# Patient Record
Sex: Female | Born: 1970 | Race: Black or African American | Hispanic: No | Marital: Married | Smoking: Never smoker
Health system: Southern US, Community
[De-identification: ages and names within clinical notes are randomized; demographics above are authoritative.]

## PROBLEM LIST (undated history)

## (undated) DIAGNOSIS — H409 Unspecified glaucoma: Secondary | ICD-10-CM

## (undated) DIAGNOSIS — J40 Bronchitis, not specified as acute or chronic: Secondary | ICD-10-CM

## (undated) DIAGNOSIS — I739 Peripheral vascular disease, unspecified: Secondary | ICD-10-CM

## (undated) DIAGNOSIS — F419 Anxiety disorder, unspecified: Secondary | ICD-10-CM

## (undated) DIAGNOSIS — R112 Nausea with vomiting, unspecified: Secondary | ICD-10-CM

## (undated) DIAGNOSIS — D649 Anemia, unspecified: Secondary | ICD-10-CM

## (undated) DIAGNOSIS — I10 Essential (primary) hypertension: Secondary | ICD-10-CM

## (undated) DIAGNOSIS — Z9889 Other specified postprocedural states: Secondary | ICD-10-CM

## (undated) DIAGNOSIS — E785 Hyperlipidemia, unspecified: Secondary | ICD-10-CM

## (undated) DIAGNOSIS — M65312 Trigger thumb, left thumb: Secondary | ICD-10-CM

## (undated) HISTORY — DX: Hyperlipidemia, unspecified: E78.5

## (undated) HISTORY — PX: DIAGNOSTIC LAPAROSCOPY: SUR761

## (undated) HISTORY — PX: COLONOSCOPY: SHX174

## (undated) HISTORY — PX: WISDOM TOOTH EXTRACTION: SHX21

## (undated) HISTORY — DX: Unspecified glaucoma: H40.9

## (undated) HISTORY — PX: ABDOMINAL HYSTERECTOMY: SHX81

---

## 1999-03-20 ENCOUNTER — Other Ambulatory Visit: Admission: RE | Admit: 1999-03-20 | Discharge: 1999-03-20 | Payer: Self-pay | Admitting: Obstetrics & Gynecology

## 1999-09-09 HISTORY — PX: MYOMECTOMY: SHX85

## 1999-10-08 ENCOUNTER — Inpatient Hospital Stay (HOSPITAL_COMMUNITY): Admission: RE | Admit: 1999-10-08 | Discharge: 1999-10-10 | Payer: Self-pay | Admitting: Obstetrics & Gynecology

## 1999-10-08 ENCOUNTER — Encounter (INDEPENDENT_AMBULATORY_CARE_PROVIDER_SITE_OTHER): Payer: Self-pay

## 2000-04-28 ENCOUNTER — Other Ambulatory Visit: Admission: RE | Admit: 2000-04-28 | Discharge: 2000-04-28 | Payer: Self-pay | Admitting: Obstetrics & Gynecology

## 2001-06-21 ENCOUNTER — Other Ambulatory Visit: Admission: RE | Admit: 2001-06-21 | Discharge: 2001-06-21 | Payer: Self-pay | Admitting: Obstetrics & Gynecology

## 2002-07-12 ENCOUNTER — Other Ambulatory Visit: Admission: RE | Admit: 2002-07-12 | Discharge: 2002-07-12 | Payer: Self-pay | Admitting: Obstetrics & Gynecology

## 2003-08-09 ENCOUNTER — Other Ambulatory Visit: Admission: RE | Admit: 2003-08-09 | Discharge: 2003-08-09 | Payer: Self-pay | Admitting: Obstetrics & Gynecology

## 2004-08-18 ENCOUNTER — Other Ambulatory Visit: Admission: RE | Admit: 2004-08-18 | Discharge: 2004-08-18 | Payer: Self-pay | Admitting: Obstetrics & Gynecology

## 2004-11-06 ENCOUNTER — Other Ambulatory Visit: Admission: RE | Admit: 2004-11-06 | Discharge: 2004-11-06 | Payer: Self-pay | Admitting: Obstetrics & Gynecology

## 2005-04-14 ENCOUNTER — Other Ambulatory Visit: Admission: RE | Admit: 2005-04-14 | Discharge: 2005-04-14 | Payer: Self-pay | Admitting: Obstetrics & Gynecology

## 2005-08-24 ENCOUNTER — Other Ambulatory Visit: Admission: RE | Admit: 2005-08-24 | Discharge: 2005-08-24 | Payer: Self-pay | Admitting: Obstetrics & Gynecology

## 2006-11-04 ENCOUNTER — Ambulatory Visit (HOSPITAL_COMMUNITY): Admission: RE | Admit: 2006-11-04 | Discharge: 2006-11-04 | Payer: Self-pay | Admitting: Obstetrics and Gynecology

## 2006-11-04 ENCOUNTER — Encounter (INDEPENDENT_AMBULATORY_CARE_PROVIDER_SITE_OTHER): Payer: Self-pay | Admitting: Obstetrics and Gynecology

## 2007-03-12 HISTORY — PX: MYOMECTOMY: SHX85

## 2007-03-21 ENCOUNTER — Encounter (INDEPENDENT_AMBULATORY_CARE_PROVIDER_SITE_OTHER): Payer: Self-pay | Admitting: Obstetrics & Gynecology

## 2007-03-21 ENCOUNTER — Inpatient Hospital Stay (HOSPITAL_COMMUNITY): Admission: RE | Admit: 2007-03-21 | Discharge: 2007-03-23 | Payer: Self-pay | Admitting: Obstetrics & Gynecology

## 2008-09-03 ENCOUNTER — Encounter (INDEPENDENT_AMBULATORY_CARE_PROVIDER_SITE_OTHER): Payer: Self-pay | Admitting: Obstetrics and Gynecology

## 2008-09-03 ENCOUNTER — Ambulatory Visit (HOSPITAL_COMMUNITY): Admission: RE | Admit: 2008-09-03 | Discharge: 2008-09-03 | Payer: Self-pay | Admitting: Obstetrics and Gynecology

## 2009-08-01 ENCOUNTER — Inpatient Hospital Stay (HOSPITAL_COMMUNITY): Admission: AD | Admit: 2009-08-01 | Discharge: 2009-08-01 | Payer: Self-pay | Admitting: Obstetrics & Gynecology

## 2009-09-04 ENCOUNTER — Inpatient Hospital Stay (HOSPITAL_COMMUNITY): Admission: RE | Admit: 2009-09-04 | Discharge: 2009-09-07 | Payer: Self-pay | Admitting: Obstetrics & Gynecology

## 2010-07-29 LAB — RH IMMUNE GLOB WKUP(>/=20WKS)(NOT WOMEN'S HOSP)

## 2010-07-29 LAB — RPR: RPR Ser Ql: NONREACTIVE

## 2010-07-29 LAB — CBC
MCHC: 33.9 g/dL (ref 30.0–36.0)
MCV: 91.5 fL (ref 78.0–100.0)
MCV: 92 fL (ref 78.0–100.0)
Platelets: 162 10*3/uL (ref 150–400)
RDW: 14 % (ref 11.5–15.5)
RDW: 14.1 % (ref 11.5–15.5)
WBC: 12.7 10*3/uL — ABNORMAL HIGH (ref 4.0–10.5)

## 2010-08-20 LAB — CBC
HCT: 37.4 % (ref 36.0–46.0)
MCHC: 34.7 g/dL (ref 30.0–36.0)
MCV: 93.1 fL (ref 78.0–100.0)
RBC: 4.02 MIL/uL (ref 3.87–5.11)

## 2010-08-20 LAB — PREGNANCY, URINE: Preg Test, Ur: NEGATIVE

## 2010-09-23 NOTE — Op Note (Signed)
NAMEKIMLEY, APSEY                  ACCOUNT NO.:  [id number]   MEDICAL RECORD NO.:  [id number]          PATIENT TYPE:  INP   LOCATION:  9303                          FACILITY:  [facility]   PHYSICIAN:  Freddy Finner, M.D.   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  A total of three myomas removed, varying in size from less than  1 cm, to approximately 3 cm x 2 cm.   OPERATIVE PROCEDURE:  Myomectomy.   ESTIMATED INTRAOPERATIVE BLOOD LOSS:  Was 100 mL.   INTRAOPERATIVE COMPLICATIONS:  None.   SURGEON:  Freddy Finner, M.D.   ASSISTANT:  Duke Salvia. Marcelle Overlie, M.D.   ANESTHESIA:  General endotracheal anesthesia.   PREOPERATIVE PROPHYLACTIC MEASURES:  Included a bolus of Cefoxitin 1  gram and PAS compression hose.   INDICATIONS FOR PROCEDURE:  The history of the present illness recorded  in the admission note.   DESCRIPTION OF PROCEDURE:  The patient was brought to the operating room  and placed under adequate general endotracheal anesthesia.  She was  placed in the dorsal recumbent position.  The abdomen, perineum and  vagina were prepped and draped in the usual fashion with Betadine scrub  and Betadine solution.  The Foley catheter was placed using a sterile  technique.  After the application of sterile drapes, a lower abdominal  incision was made in a transverse direction just above the symphysis  through an old scar.  A  dissection was carried sharply to fascia which  was entered sharply and extended to the extent of the skin incision.  The rectus sheath was developed superiorly and inferiorly with blunt and  sharp dissection.  The rectus muscles were divided in the midline.  The  subcutaneous and sub-fascial vessels were controlled with the Bovie.  The peritoneum was identified and entered bluntly with the Mayo scissors  and extended sharply and bluntly to the extent of the skin incision.  Exploration of the upper abdomen revealed no palpable abnormality of the  liver, gallbladder, kidneys or other apparent adhesions or palpable  abnormalities.  The appendix was visualized and was normal.  The tubes  and ovaries were carefully inspected and found to be completely normal.  The only pelvic adhesions were on advancement of the peritoneum  anteriorly near the bladder flap.  This was consistent with previous  myomectomy.  The self-retaining McCullough-O'Sullivan retractor was  used.  Moist packs were used to pack the intestinal contents out of the  pelvis.  The pelvis was filled with sloppy wet packs to elevate the  uterus into the operative field.  The initial incision was made along  the posterior fundus in the midline, and the largest myoma with an  attached smaller myoma was resected.  This included entry into the  endometrial cavity.  The endometrial cavity was carefully preserved and  the myometrium closed in two layers.  The first layer was used to  recreate the endometrial cavity and to close the deep layer of  myometrium.  The superficial myometrium was closed with a running  vesical suture of #2-0 Monocryl and #0 Monocryl pop-offs were used for  the deep layer.  The adhesions anteriorly were released and the myoma  excised from the lower uterine segment just lateral to the midline.  This defect was closed with figure-of-eights  with #0 Monocryl.  A small  sub-serosal lesion was removed from the left  posterior fundus.  It  measured approximately 5 to 6 mm.  An even smaller lesion was removed  from the left anterior fundus, measuring approximately 3 to 4 mm.  No  other palpable abnormalities could be appreciated.  Figure-of-eights  with #2-0 Monocryl were used to close the smaller incisions and a larger  one anteriorly along the lower segment was closed with figure-of-eights  of #0 Monocryl.  The peritoneum was reapproximated over the lesion on  the anterior lower segment.  Copious irrigation was carried out.  Hemostasis was complete.  All packs, needles and instruments were  removed and the counts were correct.  Irrigation solution was aspirated.  Hemostasis was complete.  Intercede was placed over the posterior and  upper and anterior fundus.  The uterus was allowed to settle into the  pelvis.  The abdominal incision was then closed in layers.  Running #0  Vicryl was used to close the peritoneum and reapproximate the rectus  muscles.  The fascia was closed with running #0 PDS running from angle  to angle.  The subcutaneous tissue was approximated with running #2-0  plain.  The skin was closed with broad skin staples and 1/4 inch Steri-  Strips.   The patient was awakened and taken to the recovery room in good  condition.      Freddy Finner, M.D.  Electronically Signed     WRN/MEDQ  D:  03/21/2007  T:  03/22/2007  Job:  [id number]

## 2010-09-23 NOTE — Op Note (Signed)
Kathy Kathy Garrett, Kathy Garrett                  ACCOUNT NO.:  [id number]   MEDICAL RECORD NO.:  [id number]          PATIENT TYPE:  AMB   LOCATION:  SDC                           FACILITY:  [facility]   PHYSICIAN:  Fermin Schwab, MD   DATE OF BIRTH:  [birth year]   DATE OF PROCEDURE:  09/03/2008  DATE OF DISCHARGE:                               OPERATIVE REPORT   PREOPERATIVE DIAGNOSES:  Probable intrauterine adhesions, probable  pelvic adhesions.   POSTOPERATIVE DIAGNOSES:  Endometrial polyp, pelvic adhesions, anterior  intramural myoma.   PROCEDURES:  1. Hysteroscopy.  2. Suction curettage.  3. Laparoscopy.  4. Lysis of adhesions.  5. Enterolysis.   SURGEON:  Fermin Schwab, MD   ANESTHESIA:  General anesthesia.   FINDINGS:  On exam under anesthesia, the uterus was normal-sized,  anteverted, and mobile.  No adnexal masses were palpable.  On  hysteroscopy, the endocervical canal was normal.  The endometrial cavity  was slightly rotated to the right, status post previous myomectomies.  There was anterior polypoid thickening of the endometrium protruding  into the canal, covering the entire endometrial surface.  A gland  opening similar to adenomyosis was noted anteriorly.  In the distorted  configuration of the uterus, the ostial lesions were identified, but the  tubal ostia were sealed over with endometrium.  The uterus sounded to  8.5 cm.  On laparoscopy, inspection of the liver surface, gallbladder,  and diaphragm surfaces showed within normal findings.  Appendix was  normal.  There were dense anterior lower uterine segment adhesions to  the bladder peritoneum, these were lysed.  The uterus contained a 2 x 2  cm anterior intramural myoma.  The posterior cul-de-sac was normal.  The  left tube was normal in its entire course with fimbria 5/5 on  chromotubation of the left tube filled and spilled.  The left ovary was  somewhat small and appeared within normal limits.  The right tube was  adherent to the posterior uterine corpus at several points, giving it a  convoluted look.  The adhesions were dense.  There was also a dense  adhesion pulling the distal end of the tube and joining it to the small  bowel.  The fimbria was otherwise 5/5.  The right tube filled with  chromotubation in a sluggish manner and did not spill (preoperative HSG  had shown the right tube to be patent). The right ovary had a corpus  luteum and it was free of adhesions.  There was no peritoneal lesions  such as endometriosis.   DESCRIPTION OF PROCEDURE:  The patient was placed in lithotomy position.  A 1 g of cefazolin was given intravenously for prophylaxis.  General  endotracheal anesthesia was given.  The patient was placed in lithotomy  position.  Abdomen, peritoneum, and vagina were prepped.  The patient  was draped in a sterile manner.  A Foley catheter was inserted.  Exam  under anesthesia revealed the above findings.  A vaginal speculum was  inserted.  The cervix was grasped with a tenaculum.  Using 3% sorbitol,  diagnostic hysteroscopy was started  with slender 30-degree hysteroscope.  The above findings were noted.  Using hysteroscopic scissors, some of  the anterior endometrium was shaved off.  Due to the size of the  polypoid endometrium, a size 7 curette was introduced into the areas  after cervical dilation to 23-French.  Suction curettage was performed  and moderate amount of tissue was obtained.  Hysteroscopy was repeated  after D and C and the tubal ostia were spilled, not clearly visualized.  The hysteroscopy was terminated.  Tissue was submitted to Pathology.  Surgeon was regloved and after placing a Zumi catheter and inflating its  balloon in the uterus.  This was connected to indigo carmine dilute  solution and used for manipulation and uterine injection.  Next, a field  was created on the abdomen and infraumbilical 10-mm skin incision was  made after preoperative anesthesia with  0.25% Marcaine.  A Veress needle  was inserted, and its correct location was verified.  The  pneumoperitoneum was created with carbon dioxide.  Two 5-mm lower  quadrant incisions were placed in each lower quadrant under direct  visualization.  Using monopolar needle electrode and cutting current of  35 watts, the tubal-intestinal adhesion was carefully lysed.  Next, the  right tube was freed up from all of its adhesions.  The chromotubation  showed above findings.  Repeat chromotubation failed to allow spillage  from the right tube, but we felt comfortable with patency demonstrated  by preoperative HSG on the right side.  A slurry of Seprafilm was  prepared with one sheet of Seprafilm and 20 mL of lactated Ringer and  injected into the posterior and anterior cul-de-sac with a 16-gauge red  rubber catheter in an attempt to prevent adhesions.  Estimated blood  loss was 20 mL.  Trocars were removed.  The sleeves were removed.  Gas  was allowed to come out.  Instruments and pad count was correct.  The  patient tolerated the procedure well and was transferred to recovery  room in satisfactory condition.      Fermin Schwab, MD  Electronically Signed     TY/MEDQ  D:  09/03/2008  T:  09/04/2008  Job:  [id number]   cc:   Freddy Finner, M.D.  Fax: [phone]

## 2010-09-23 NOTE — Op Note (Signed)
Kathy Garrett, Kathy Garrett                  ACCOUNT NO.:  [id number]   MEDICAL RECORD NO.:  [id number]          PATIENT TYPE:  AMB   LOCATION:  SDC                           FACILITY:  [facility]   PHYSICIAN:  Dineen Kid. Rana Snare, M.D.    DATE OF BIRTH:  [birth year]   DATE OF PROCEDURE:  11/04/2006  DATE OF DISCHARGE:                               OPERATIVE REPORT   PREOPERATIVE DIAGNOSIS:  Embryonic demise, 65 weeks' gestational age.   POSTOPERATIVE DIAGNOSIS:  Embryonic demise, 10 weeks' gestational age.   PROCEDURE:  Dilation and evacuation.   SURGEON:  Dineen Kid. Rana Snare, M.D.   ANESTHESIA:  Monitored anesthesia care and paracervical block.   INDICATIONS:  Ms. Castiglia is a 40 year old, G1, P0, at 80 weeks'  gestational age who presented with spotting yesterday to the office.  Ultrasound evaluation shows embryonic demise. Fetus measured 6-1/2  weeks' gestational size. She desires dilation and evacuation. The risks  and benefits of the procedure were discussed at length. Informed consent  was obtained. Her blood type was O positive.   DESCRIPTION OF PROCEDURE:  After analgesia, the patient placed in the  dorsal lithotomy position. She was sterilely prepped and draped. The  bladder was sterilely draped. A Graves speculum was placed. A tenaculum  was placed on the anterior lip of the cervix of the cervix. A  paracervical block was placed with 1% Xylocaine 1:100,000 epinephrine, a  total of 20 mL used. Uterus was dilated to a #29 Pratt dilator and was  sounded to 10 mL. An 8-mm suction curet was inserted. Products of  conception were retrieved. Curettage was performed until a gritty  surface was felt about the endometrial cavity and no more products of  contraception were being retrieved. The patient received 0.2 mg of  Methergine IM with good uterine response. The curet was removed.  Tenaculum removed from the anterior lip of the cervix. Cervix was noted  to be hemostatic. The patient was then transferred to  the recovery room  in stable condition. Sponge, needle and instrument count was normal x3.  Estimated blood loss was minimal. The patient received 30 mg of Toradol  postoperatively.   DISPOSITION:  The patient will be discharged home. Follow up in the  office in 2 to 3 weeks. Sent home with a routine instruction sheet for  D&E. Told to return for increased pain, fever or bleeding. She was sent  home with a prescription for doxycycline 100 mg p.o. b.i.d. for 7 days  and Methergine 0.2 mg to take every 8 hours for 2 days.      Dineen Kid Rana Snare, M.D.  Electronically Signed     DCL/MEDQ  D:  11/04/2006  T:  11/04/2006  Job:  [id number]

## 2010-09-23 NOTE — H&P (Signed)
Kathy Garrett, BAYARD                  ACCOUNT NO.:  [id number]   MEDICAL RECORD NO.:  [id number]          PATIENT TYPE:  AMB   LOCATION:  SDC                           FACILITY:  [facility]   PHYSICIAN:  Freddy Finner, M.D.   DATE OF BIRTH:  [birth year]   DATE OF ADMISSION:  DATE OF DISCHARGE:                              HISTORY & PHYSICAL   ADMISSION DIAGNOSES:  1. Recurrent uterine leiomyomata.  2. Large submucosal leiomyoma compromising reproductive function with      recent spontaneous abortion, probably secondary to fibroid.   The patient is a 40 year old black married female, gravida 1, para 0,  who has a long history of uterine leiomyomata and had a myomectomy in  2001.  She recently conceived and had a missed AB in June of 2008.  She  subsequently had a sonohysterogram in the office which showed a large  submucous myoma measuring 4.8 x 4.1 x 4.3 cm.  There was another  questionable 3 cm density within the uterine cavity.  In addition there  were at least three, perhaps four, other myomas measuring between 10 mm  and 3.8 cm.  The patient wishes to preserve reproductive capacity and  for that reason, is admitted now for a second myomectomy.  The potential  risks of the procedure including hemorrhage, infection, and hysterectomy  in the event of uncontrollable hemorrhage have been discussed with the  patient further.  The risk of deep vein thrombosis and injury to other  organs has been discussed, including prophylactic measures which will be  taken to reduce intraoperative risks.  The patient is now admitted and  prepared to proceed with surgery.   REVIEW OF SYSTEMS:  Her current review of systems is otherwise negative.  She has no cardiopulmonary, GI, or GU complaints.   PAST MEDICAL HISTORY:  Her only known medical illness is hypertension  for which she has been treated intermittently, most recently with  Diovan.   PAST SURGICAL HISTORY:  1. Myomectomy noted above.  2. She has  also had a D&C for incomplete spontaneous AB in June of      2008.  3. 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  She has never required a blood transfusion.   She does not use cigarettes.   FAMILY HISTORY:  Noncontributory.   PHYSICAL EXAMINATION:  HEENT:  Grossly within normal limits.  NECK:  Thyroid gland is not palpably enlarged.  CHEST:  Clear to auscultation.  HEART:  Normal sinus rhythm without murmurs, rubs, or gallops.  ABDOMEN:  Soft and nontender without appreciable organomegaly or  palpable masses.  BREASTS:  Exam is normal.  No palpable masses.  No nipple discharge or  skin change.  PELVIC:  External genitalia, vagina, and cervix are normal to  inspection.  Bimanual reveals the uterus to be anterior position,  approximately 10 weeks' size.  There are no palpable adnexal masses.  RECTUM:  Normal.  Rectovaginal exam confirms the above  findings.   Most recent blood pressure in the office 104/72.   Most recent Pap test in the office was in April of 2008 and was normal.  EXTREMITIES:  Without cyanosis, clubbing, or edema.   ASSESSMENT:  Uterine leiomyomata, large submucous myoma distorting the  endometrial cavity.   PLAN:  Laparotomy for myomectomy.      Freddy Finner, M.D.  Electronically Signed     WRN/MEDQ  D:  03/18/2007  T:  03/19/2007  Job:  [id number]

## 2010-09-26 NOTE — H&P (Signed)
[facility] [place]  Patient:    Kathy Garrett               MRN: [id number] Adm. Date:  10/08/99 Attending:  Freddy Finner, M.D.                         History and Physical  SOCIAL SECURITY NUMBER:       SS# [id number]  ADMITTING DIAGNOSIS:          Large uterine leiomyomata with recent increase in size.   HISTORY:                      The patient is a 40 year old single black female gravida 0, who has been followed in my office for approximately 11 years.  She was first noted to have uterine leiomyomata in November 2000, having had a normal exam the previous year in October 1999.  Pelvic ultrasound showed rather large fibroids measuring 5 x 4.7 x 3.0 cm and 8.8 x 6.7 x 4.2 cm.  Also there was a smaller 13 mm lesion on the anterior fundus.  She is now admitted for myomectomy.  CURRENT REVIEW OF SYSTEMS:    Negative.  There are no cardiopulmonary, GI, or GU complaints.  PAST MEDICAL HISTORY:         The patient has no known significant medical problems, except hypertension.  For this, she has taken Norvasc in the past, 10 mg a day.  ALLERGIES:                    She has no known allergies to medications.  This is her only current medication.  PAST SURGICAL HISTORY:        Her only previous surgical procedure is extraction of wisdom teeth at age 41.  She has never had a blood transfusion.   SOCIAL HISTORY:               She does not use cigarettes or alcohol.  FAMILY HISTORY:               Noncontributory.  PHYSICAL EXAMINATION  HEENT:                        Grossly within normal limits.  NECK:                         Thyroid gland is not palpably enlarged.  VITAL SIGNS:                  Blood pressure in the office is 128/74.  CHEST:                        Clear to auscultation.  CARDIAC:                      Heart normal sinus rhythm without murmurs, rubs, or gallops.  BREASTS:                      Exam is normal.  No  masses, no skin change, no nipple discharge.  ABDOMEN:                      Abdomen is soft and nontender without appreciable organomegaly or palpable masses.  PELVIC:  Bimanual exam reveals a regular enlargement of the uterus consistent with approximately nine weeks gestational size.  EXTREMITIES:                  Without cyanosis, clubbing, or edema.  ASSESSMENT:                   1. Uterine leiomyomata first noted approximately                                  six months prior to this admission.                               2. Rather marked enlargement of fibroids as                                  noted above.  PLAN:                         Myomectomy.  INFORMED CONSENT:             The potential risks of hysterectomy has been discussed with the patient including additional risks of surgery of hemorrhage and infection and injury to other organs.  She is prepared to proceed with surgery and is admitted at this time for that purpose. DD:  10/07/99 TD:  10/08/99 Job: [id number]

## 2010-09-26 NOTE — Discharge Summary (Signed)
[facility] [place]  Patient:    Kathy Garrett, Kathy Garrett               MRN: [id number] Adm. Date:  [id number] Disc. Date: [id number] Attending:  Minette Headland                           Discharge Summary  DISCHARGE DIAGNOSIS:          Multiple uterine leiomyomata with recent increase in size.  PROCEDURE:                    Myomectomy.  COMPLICATIONS:                None.  DISPOSITION:                  The patient is in satisfactory improved condition at the time of her discharge.  She is to have progressively increasing physical activity, but no heavy lifting.  She is to call for fever, severe pain, or heavy bleeding.  She is to take a regular diet as tolerated.  She is given Percocet to be taken as needed for postoperative pain.  She can also use Tylenol or ibuprofen. She is to use a mild laxative of choice and is to use Dulcolax suppositories as needed. She is to return to the office in two weeks for postoperative follow-up.  HISTORY OF PRESENT ILLNESS: PAST MEDICAL HISTORY: FAMILY HISTORY: REVIEW OF SYSTEMS: PHYSICAL EXAMINATION:         Details recorded in the admission note and/or the  operative summary.  The patient was admitted on the morning of surgery.  Her physical findings were remarkable for enlargement of the uterus with significant enlargement of fibroids with measurements of 8 and 5 cm of the two largest by ultrasound findings.  LABORATORY DATA:              During this admission includes a postoperative hemoglobin of 11.2, normal urinalysis on admission, hemoglobin of 13.5 on admission.  HOSPITAL COURSE:              The patient was admitted on the morning of surgery. The above described procedure was accomplished through an abdominal Pfannenstiel incision without difficulty.  Estimated blood loss was 150 cc.  Her postoperative course was without complications.  By the afternoon of the second hospital day, she was able  to have adequate flatus and was tolerating a regular diet.  This made er condition adequate for discharge.  She was discharged to home with disposition s noted above.  She is to have progressively increasing activity. DD:  10/10/99 TD:  10/14/99 Job: [id number]

## 2010-09-26 NOTE — Op Note (Signed)
[facility] [place]  Patient:    Kathy Garrett, Kathy Garrett               MRN: [id number] Proc. Date: 10/08/99 Adm. Date:  [id number] Attending:  Minette Headland                           Operative Report  PREOPERATIVE DIAGNOSIS:       Uterine leiomyomata with marked recent increase                               in size of leiomyomas.  POSTOPERATIVE DIAGNOSIS:      Uterine leiomyomata with marked recent increase                               in size of leiomyomas.  OPERATION:                    Myomectomy.  SURGEON:                      Freddy Finner, M.D.  ASSISTANT:  ANESTHESIA:                   General endotracheal  ESTIMATED BLOOD LOSS:         150 cc  INTRAOPERATIVE COMPLICATIONS: None.  INDICATIONS:                  The patient is a 40 year old admitted on the morning of surgery. For details of the present illness see the admission note. She was given a gram of Cefotan IV. She was placed in PAS hose.  DESCRIPTION OF PROCEDURE:     She was brought to the operating room and there placed under adequate general endotracheal anesthesia.  Placed in the dorsal lithotomy position.  Betadine prep of abdomen, perineum and vagina was carried out.  Foley catheter was placed using sterile technique. Sterile drapes were applied. Lower abdominal transverse incision was made and carried sharply down the fascia which was then sharply extended to the extent of the skin incision. Rectus sheath was developed superiorly and inferiorly with blunt and sharp dissection.  Rectus muscle was split in the midline.  Subfascial vessels were controlled with the Bovie.  Peritoneum was entered bluntly and extended sharply to the extent of the skin incision.  Palpation of the upper abdomen revealed no apparent abnormalities of gallbladder, liver, kidneys, bowel or omental adhesions or incarceration.  Appendix was visualized and was normal. Self retaining OConnor-OSullivan  retractor was placed. Moist pack was used to pack the intestinal contents out of the pelvis.  The uterus was markedly enlarged. Incision was made posteriorly over the fundal fibroid posteriorly. This was sharply dissected free and measured approximately 8 to 10 cm in maximum dimension.  A major bleeding source was controlled with interrupted 2-0 Vicryl suture. Deep layers of suture were placed using 0 Monocryl.  A small segment of the distended myometrium overlying the fibroid was excised. The defect was closed superficially with running 3-0 Monocryl. Hemostasis here was adequate.  There was a pedunculated lesion on the superior fundus.  There was an approximately 1 to 1.5 cm lesion anteriorly. Incision was over this and it was removed.  The defect was closed with two figure-of-eights of 0 Monocryl. There was a 2 mm fibroid  just adjacent to the pedunculated lesion on the superior fundus that was excised with the Bovie and the defect fulgurated with a Bovie.  The pedunculated lesion was excised. Defect was closed with interrupted of 3-0 Monocryl in a figure-of-eight pattern. Superficial closure was carried out with 3-0 Monocryl.  Irrigation was carried out.  Hemostasis was complete, placed over the lesion particularly over the posterior lesions and this was moistened with antibiotic solution.  Hemostasis was complete. All packs and instruments were removed.  Counts were correct.  Final incision was closed in layers with running 0 Monocryl close the peritoneum and reapproximate the rectus muscle.  Fascia was closed with a running PDS. Skin was closed with skin staples and 1/4 inch Steri-Strips.  The patient tolerate d the procedure well and was taken to recovery room in good condition. DD: 10/08/99 TD:  10/08/99 Job: [id number]

## 2010-09-26 NOTE — Discharge Summary (Signed)
NAMEBREALYNN, Kathy Garrett                  ACCOUNT NO.:  [id number]   MEDICAL RECORD NO.:  [id number]          PATIENT TYPE:  INP   LOCATION:  9303                          FACILITY:  [facility]   PHYSICIAN:  Freddy Finner, M.D.   DATE OF BIRTH:  [birth year]   DATE OF ADMISSION:  03/21/2007  DATE OF DISCHARGE:  03/23/2007                               DISCHARGE SUMMARY   DISCHARGE DIAGNOSES:  1. Uterine leiomyomata with a large submucous myoma, felt responsible      for recent pregnancy loss.   PROCEDURE:  Exploratory laparotomy with myomectomy. Histologic  examination of surgically excised tissue showed benign myoma's.   DISPOSITION:  By the morning of the second postoperative day, the  patient was ambulating without difficulty, having adequate bowel and  bladder function, tolerating a regular diet.   ACTIVITY:  She was discharged home with progressively increasing  activity but no heavy activity and no vaginal entry.   FOLLOWUP:  She is to return to the office in approximately 2 weeks for  postoperative followup.   DISCHARGE MEDICATIONS:  1. Percocet 5/325 to be taken 1 or 2 every 4 hours as needed for      postoperative pain. She can augment this with Ibuprofen or      substitute for this.   SPECIAL INSTRUCTIONS:  She is to call for fever, heavy vaginal bleeding  or bleeding from the incision.   HISTORY OF PRESENT ILLNESS/PAST MEDICAL HISTORY/FAMILY HISTORY/REVIEW OF  SYSTEMS/PHYSICAL EXAMINATION:  Recorded in the admission note.   PHYSICAL EXAMINATION:  GENITOURINARY:  Examination notable for uterine  fibroids.   LABORATORY DATA:  During this admission includes a normal CBC on  admission. Postoperative hemoglobin of 10.1. Preoperative hemoglobin of  12.6. Admsision prothrombin time and PTT were normal.   HOSPITAL COURSE:  The patient was admitted on the morning of her  surgery. She was treated perioperatively with serial compression hose  for her lower extremities and IV  antibiotics. She was brought to the  operating room where the above described operative procedure was  accomplished. There was minimal intraoperative blood loss and  no intraoperative complications. Her postoperative course was entirely  satisfactory. She remained afebrile throughout the hospital stay. By the  morning of the second postoperative day, she was discharged home.  Further disposition as noted above.      Freddy Finner, M.D.  Electronically Signed     WRN/MEDQ  D:  05/21/2007  T:  05/21/2007  Job:  [id number]

## 2011-01-09 ENCOUNTER — Other Ambulatory Visit: Payer: Self-pay | Admitting: Internal Medicine

## 2011-01-09 DIAGNOSIS — M542 Cervicalgia: Secondary | ICD-10-CM

## 2011-01-14 ENCOUNTER — Ambulatory Visit
Admission: RE | Admit: 2011-01-14 | Discharge: 2011-01-14 | Disposition: A | Discharge status: Home / Self Care | Payer: BC Managed Care – PPO | Source: Ambulatory Visit | Attending: Internal Medicine | Admitting: Internal Medicine

## 2011-01-14 DIAGNOSIS — M542 Cervicalgia: Secondary | ICD-10-CM

## 2011-02-17 LAB — CBC
HCT: 28 — ABNORMAL LOW
HCT: 36.6
Hemoglobin: 10.1 — ABNORMAL LOW
Hemoglobin: 12.6
MCHC: 34.5
MCHC: 35.9
MCV: 88.8
Platelets: 290
RBC: 3.16 — ABNORMAL LOW
RDW: 12.9
RDW: 13.2

## 2011-02-17 LAB — APTT: aPTT: 34

## 2011-02-25 LAB — CBC
MCHC: 33.8
MCV: 86.3
Platelets: 287
RBC: 4.2
RDW: 15.3 — ABNORMAL HIGH

## 2011-02-25 LAB — ABO/RH: ABO/RH(D): O NEG

## 2012-01-14 ENCOUNTER — Other Ambulatory Visit: Payer: Self-pay | Admitting: Internal Medicine

## 2012-01-14 DIAGNOSIS — E041 Nontoxic single thyroid nodule: Secondary | ICD-10-CM

## 2012-02-19 ENCOUNTER — Ambulatory Visit
Admission: RE | Admit: 2012-02-19 | Discharge: 2012-02-19 | Disposition: A | Discharge status: Home / Self Care | Payer: BC Managed Care – PPO | Source: Ambulatory Visit | Attending: Internal Medicine | Admitting: Internal Medicine

## 2012-02-19 DIAGNOSIS — E041 Nontoxic single thyroid nodule: Secondary | ICD-10-CM

## 2013-07-13 ENCOUNTER — Other Ambulatory Visit: Payer: Self-pay | Admitting: Internal Medicine

## 2013-07-13 DIAGNOSIS — E041 Nontoxic single thyroid nodule: Secondary | ICD-10-CM

## 2013-08-14 ENCOUNTER — Ambulatory Visit
Admission: RE | Admit: 2013-08-14 | Discharge: 2013-08-14 | Disposition: A | Discharge status: Home / Self Care | Payer: BC Managed Care – PPO | Source: Ambulatory Visit | Attending: Internal Medicine | Admitting: Internal Medicine

## 2013-08-14 DIAGNOSIS — E041 Nontoxic single thyroid nodule: Secondary | ICD-10-CM

## 2014-02-08 ENCOUNTER — Other Ambulatory Visit (HOSPITAL_COMMUNITY): Payer: Self-pay | Admitting: Internal Medicine

## 2014-02-08 ENCOUNTER — Ambulatory Visit (HOSPITAL_COMMUNITY)
Admission: RE | Admit: 2014-02-08 | Discharge: 2014-02-08 | Disposition: A | Discharge status: Home / Self Care | Payer: BC Managed Care – PPO | Source: Ambulatory Visit | Attending: Vascular Surgery | Admitting: Vascular Surgery

## 2014-02-08 DIAGNOSIS — R600 Localized edema: Secondary | ICD-10-CM | POA: Diagnosis not present

## 2014-02-08 DIAGNOSIS — R609 Edema, unspecified: Secondary | ICD-10-CM

## 2014-07-26 ENCOUNTER — Other Ambulatory Visit: Payer: Self-pay | Admitting: Obstetrics and Gynecology

## 2014-08-15 IMAGING — US US SOFT TISSUE HEAD/NECK
1 series · 14 of 25 positions shown · non-contrast
Comparison: US SOFT TISSUE HEAD/NECK dated 02/19/2012; US SOFT
TISSUE HEAD/NECK dated 01/14/2011

CLINICAL DATA: History thyroid goiter with previously demonstrated
small bilateral thyroid nodules.

EXAM:
THYROID ULTRASOUND
TECHNIQUE: Ultrasound examination of the thyroid gland and adjacent soft
tissues was performed.

[Series 1: us soft tissue head/neck · 0.05mm/px · 14 of 60 slices shown]
[im 1/60]
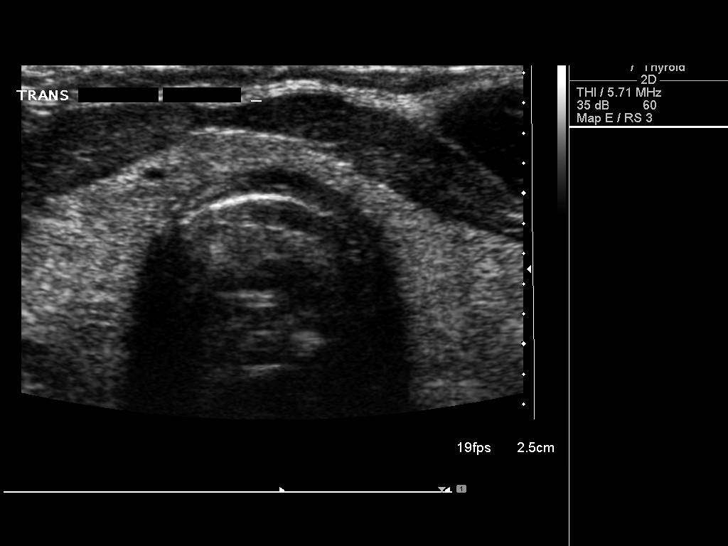
[im 5/60]
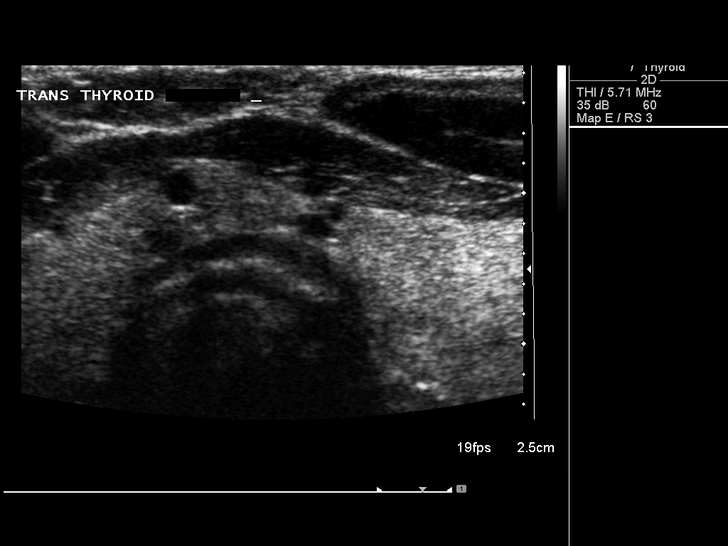
[im 10/60]
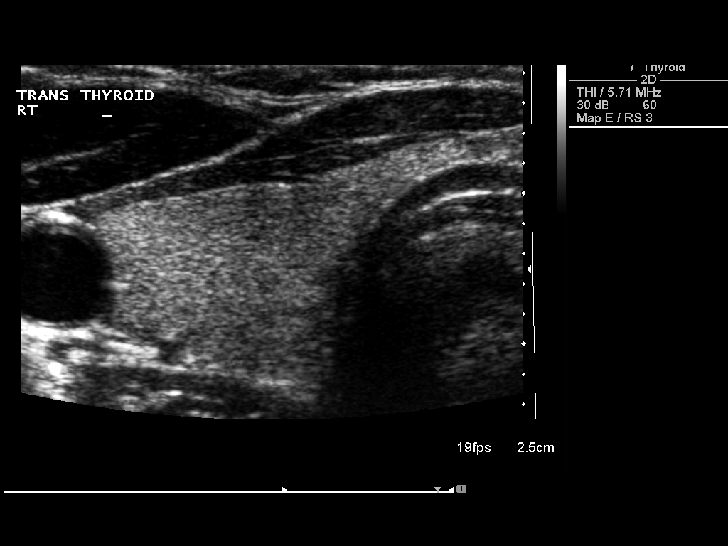
[im 15/60]
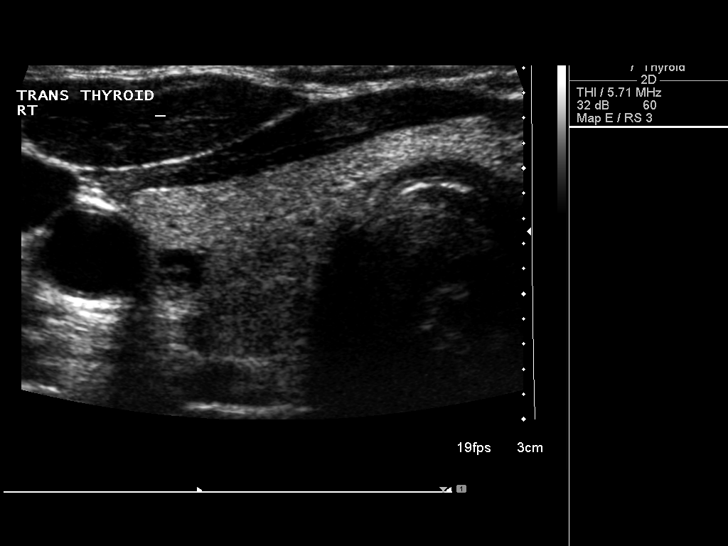
[im 20/60]
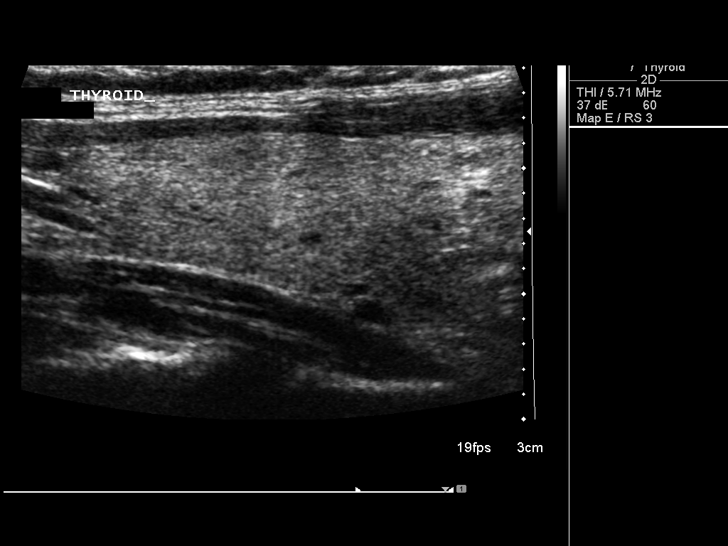
[im 23/60]
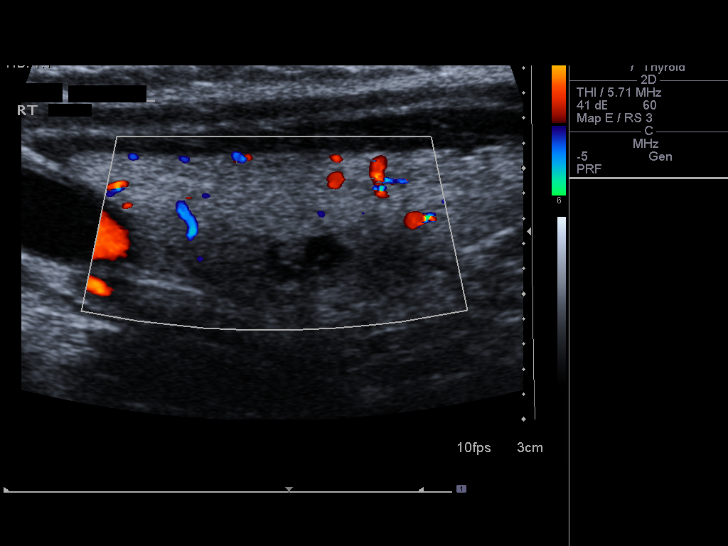
[im 28/60]
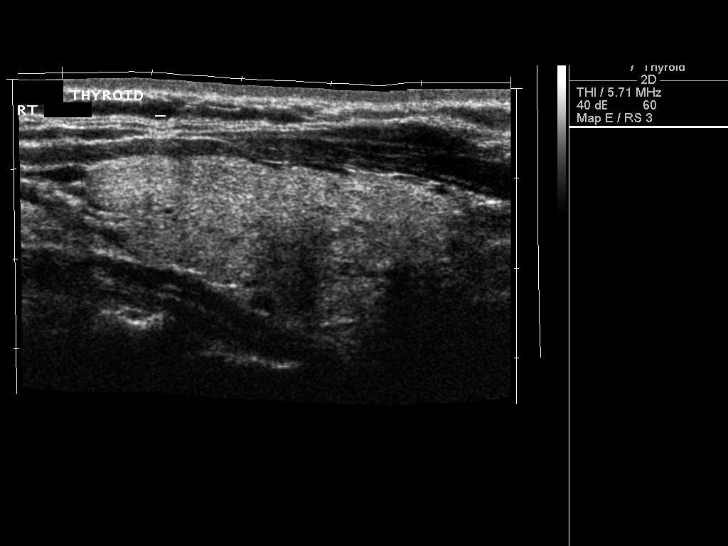
[im 32/60]
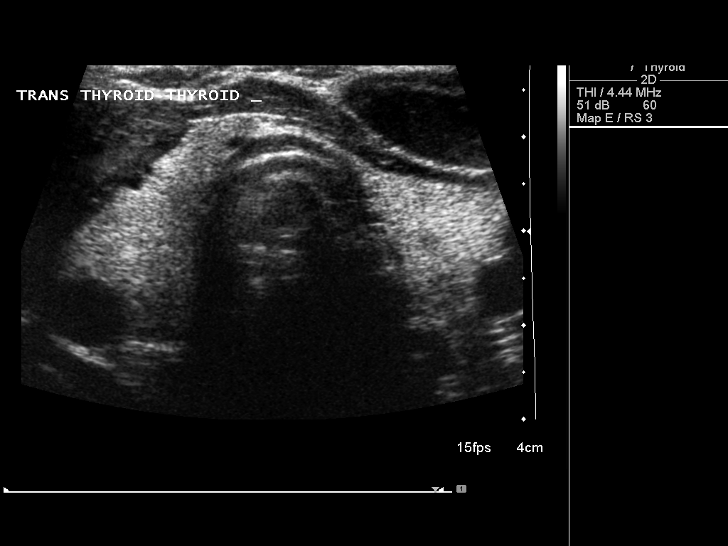
[im 37/60]
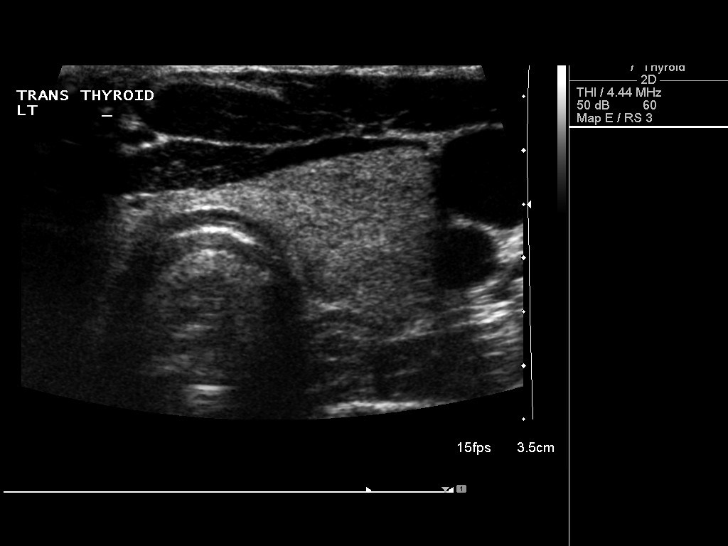
[im 40/60]
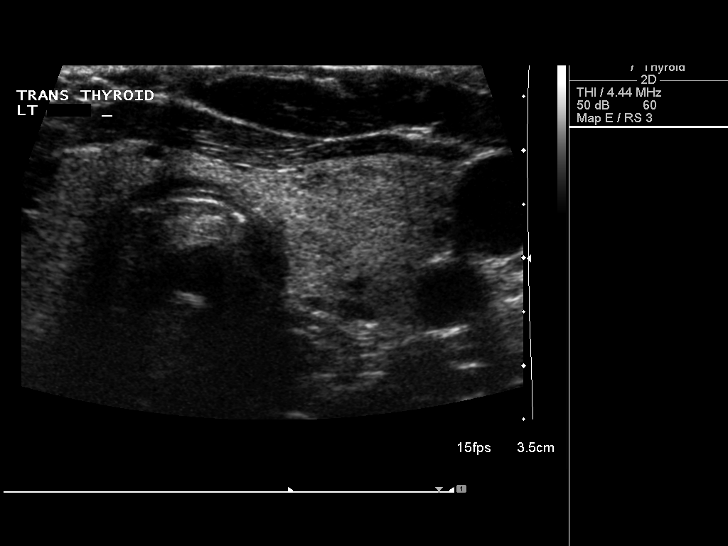
[im 45/60]
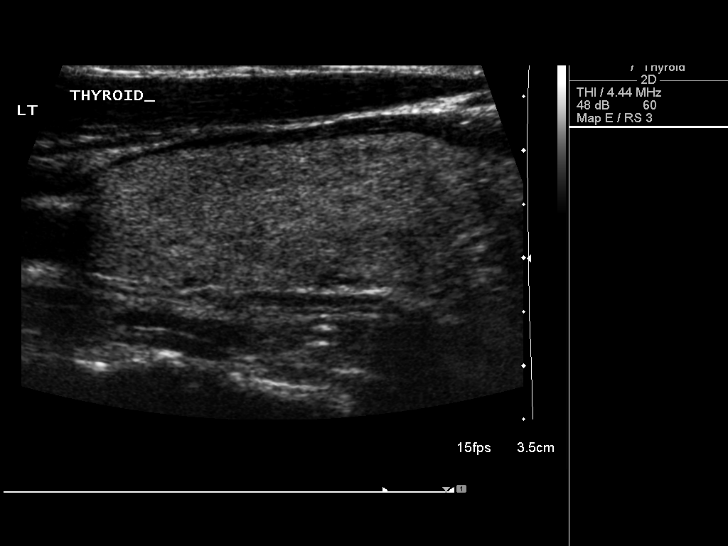
[im 50/60]
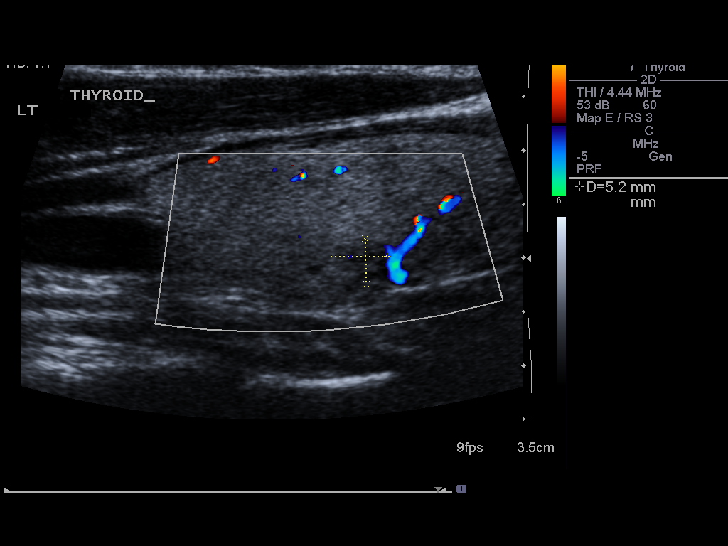
[im 55/60]
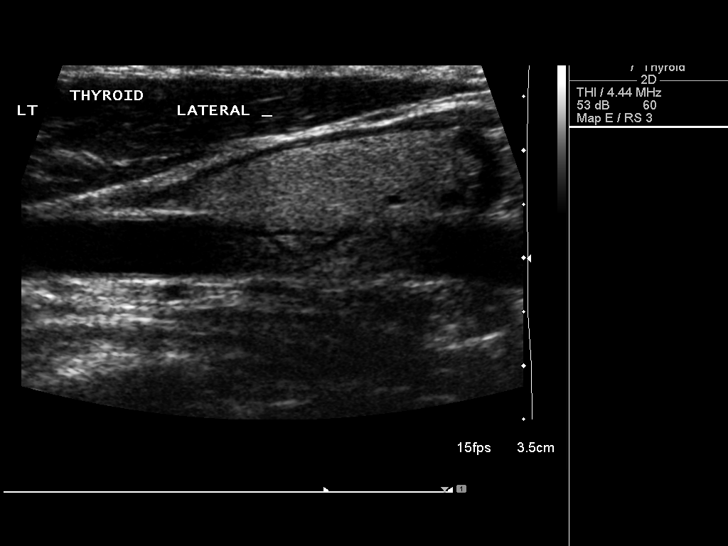
[im 60/60]
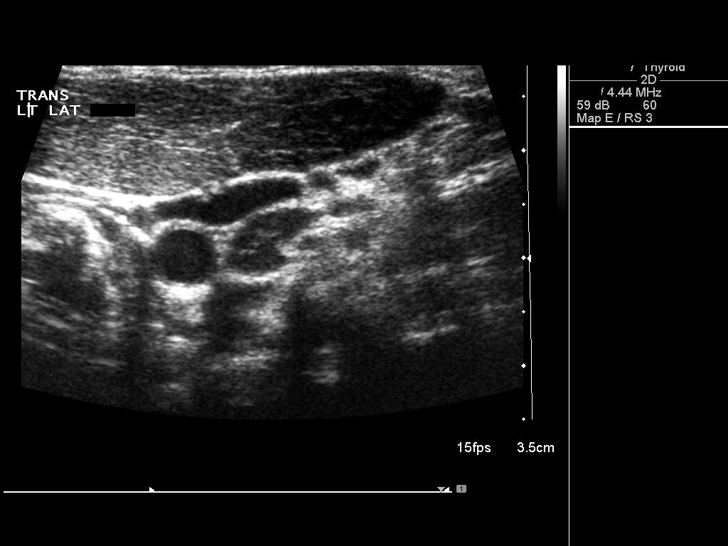

[14 of 25 positions shown; findings below may reference images not displayed]

FINDINGS: Right thyroid lobe

Measurements: 4.3 x 1.6 x 1.8 cm. Subcentimeter cysts and hypoechoic
nodules are present. A small complex cyst in the inferior right lobe
shows minimal enlargement, measuring 0.7 cm and previously measuring
0.5 cm.

Left thyroid lobe

Measurements: 4.0 x 1.4 x 1.7 cm. Posterior mid left thyroid nodule
is less prominent compared to the prior study now measuring
approximately 0.5 cm compared to previous maximal diameter of
approximately 0.8 cm.

Isthmus

Thickness: 0.3 cm.  No nodules visualized.

Lymphadenopathy

None visualized.
IMPRESSION: Subcentimeter nodules and cysts are again likely benign. Small
complex cyst in the right lobe shows minimal enlargement and a small
nodule in the left lobe shows decrease in size since the prior
study.

## 2016-04-20 ENCOUNTER — Other Ambulatory Visit: Payer: Self-pay | Admitting: Internal Medicine

## 2016-04-20 DIAGNOSIS — E041 Nontoxic single thyroid nodule: Secondary | ICD-10-CM

## 2016-04-30 ENCOUNTER — Ambulatory Visit
Admission: RE | Admit: 2016-04-30 | Discharge: 2016-04-30 | Disposition: A | Discharge status: Home / Self Care | Payer: BC Managed Care – PPO | Source: Ambulatory Visit | Attending: Internal Medicine | Admitting: Internal Medicine

## 2016-04-30 DIAGNOSIS — E041 Nontoxic single thyroid nodule: Secondary | ICD-10-CM

## 2016-06-11 DIAGNOSIS — J40 Bronchitis, not specified as acute or chronic: Secondary | ICD-10-CM

## 2016-06-11 HISTORY — DX: Bronchitis, not specified as acute or chronic: J40

## 2016-10-22 ENCOUNTER — Encounter (HOSPITAL_COMMUNITY)
Admission: RE | Admit: 2016-10-22 | Discharge: 2016-10-22 | Disposition: A | Discharge status: Home / Self Care | Payer: BC Managed Care – PPO | Source: Ambulatory Visit | Attending: Obstetrics and Gynecology | Admitting: Obstetrics and Gynecology

## 2016-10-22 ENCOUNTER — Encounter (HOSPITAL_COMMUNITY): Payer: Self-pay

## 2016-10-22 ENCOUNTER — Other Ambulatory Visit: Payer: Self-pay

## 2016-10-22 DIAGNOSIS — Z01812 Encounter for preprocedural laboratory examination: Secondary | ICD-10-CM | POA: Insufficient documentation

## 2016-10-22 HISTORY — DX: Nausea with vomiting, unspecified: R11.2

## 2016-10-22 HISTORY — DX: Peripheral vascular disease, unspecified: I73.9

## 2016-10-22 HISTORY — DX: Anemia, unspecified: D64.9

## 2016-10-22 HISTORY — DX: Nausea with vomiting, unspecified: Z98.890

## 2016-10-22 HISTORY — DX: Essential (primary) hypertension: I10

## 2016-10-22 HISTORY — DX: Bronchitis, not specified as acute or chronic: J40

## 2016-10-22 LAB — CBC
HEMATOCRIT: 37.7 % (ref 36.0–46.0)
Hemoglobin: 12.7 g/dL (ref 12.0–15.0)
MCH: 30.5 pg (ref 26.0–34.0)
MCHC: 33.7 g/dL (ref 30.0–36.0)
MCV: 90.6 fL (ref 78.0–100.0)
Platelets: 318 10*3/uL (ref 150–400)
RBC: 4.16 MIL/uL (ref 3.87–5.11)
RDW: 13.3 % (ref 11.5–15.5)
WBC: 4.5 10*3/uL (ref 4.0–10.5)

## 2016-10-22 LAB — BASIC METABOLIC PANEL
ANION GAP: 7 (ref 5–15)
BUN: 9 mg/dL (ref 6–20)
CALCIUM: 9.4 mg/dL (ref 8.9–10.3)
CHLORIDE: 104 mmol/L (ref 101–111)
CO2: 26 mmol/L (ref 22–32)
Creatinine, Ser: 0.81 mg/dL (ref 0.44–1.00)
GFR calc Af Amer: >60 mL/min (ref >60)
GFR calc non Af Amer: >60 mL/min (ref >60)
Glucose, Bld: 86 mg/dL (ref 65–99)
Potassium: 4.5 mmol/L (ref 3.5–5.1)
Sodium: 137 mmol/L (ref 135–145)

## 2016-10-22 NOTE — Patient Instructions (Addendum)
Your procedure is scheduled on: Monday October 26, 2016 at 7:30 am  Enter through the [employer] of [facility] at: 6 am  Pick up the phone at the desk and dial [phone].  Call this number if you have problems the morning of surgery: [phone].  Remember: Do NOT eat food or drink any liquids: Midnight on Sunday June 17 Do NOT drink clear liquids after: Take these medicines the morning of surgery with a SIP OF WATER: Toprol XL  Do NOT wear jewelry (body piercing), metal hair clips/bobby pins, make-up, or nail polish. Do NOT wear lotions, powders, or perfumes.  You may wear deoderant. Do NOT shave for 48 hours prior to surgery. Do NOT bring valuables to the hospital. Contacts, dentures, or bridgework may not be worn into surgery. Leave suitcase in car.  After surgery it may be brought to your room.  For patients admitted to the hospital, checkout time is 11:00 AM the day of discharge.

## 2016-10-22 NOTE — Pre-Procedure Instructions (Signed)
Dr. Annye Asa viewed EKG

## 2016-10-26 ENCOUNTER — Encounter (HOSPITAL_COMMUNITY): Payer: Self-pay

## 2016-10-26 ENCOUNTER — Encounter (HOSPITAL_COMMUNITY)
Admission: RE | Disposition: A | Discharge status: Home / Self Care | Payer: Self-pay | Source: Ambulatory Visit | Attending: Obstetrics and Gynecology

## 2016-10-26 ENCOUNTER — Inpatient Hospital Stay (HOSPITAL_COMMUNITY): Payer: BC Managed Care – PPO | Admitting: Anesthesiology

## 2016-10-26 ENCOUNTER — Inpatient Hospital Stay (HOSPITAL_COMMUNITY)
Admission: RE | Admit: 2016-10-26 | Discharge: 2016-10-28 | DRG: 743 | Disposition: A | Discharge status: Home / Self Care | Payer: BC Managed Care – PPO | Source: Ambulatory Visit | Attending: Obstetrics and Gynecology | Admitting: Obstetrics and Gynecology

## 2016-10-26 DIAGNOSIS — N920 Excessive and frequent menstruation with regular cycle: Secondary | ICD-10-CM | POA: Diagnosis present

## 2016-10-26 DIAGNOSIS — D259 Leiomyoma of uterus, unspecified: Secondary | ICD-10-CM | POA: Diagnosis present

## 2016-10-26 DIAGNOSIS — R102 Pelvic and perineal pain: Secondary | ICD-10-CM | POA: Diagnosis present

## 2016-10-26 DIAGNOSIS — F419 Anxiety disorder, unspecified: Secondary | ICD-10-CM | POA: Diagnosis present

## 2016-10-26 DIAGNOSIS — I10 Essential (primary) hypertension: Secondary | ICD-10-CM | POA: Diagnosis present

## 2016-10-26 DIAGNOSIS — Z90710 Acquired absence of both cervix and uterus: Secondary | ICD-10-CM | POA: Diagnosis present

## 2016-10-26 HISTORY — PX: HYSTERECTOMY ABDOMINAL WITH SALPINGECTOMY: SHX6725

## 2016-10-26 LAB — TYPE AND SCREEN
ABO/RH(D): O NEG
ANTIBODY SCREEN: NEGATIVE

## 2016-10-26 LAB — PREGNANCY, URINE: Preg Test, Ur: NEGATIVE

## 2016-10-26 SURGERY — HYSTERECTOMY, TOTAL, ABDOMINAL, WITH SALPINGECTOMY
Anesthesia: General | Site: Abdomen | Laterality: Bilateral

## 2016-10-26 MED ORDER — ROCURONIUM BROMIDE 100 MG/10ML IV SOLN
INTRAVENOUS | Status: DC | PRN
  Administered 2016-10-26: 50 mg via INTRAVENOUS

## 2016-10-26 MED ORDER — ONDANSETRON HCL 4 MG/2ML IJ SOLN
INTRAMUSCULAR | Status: DC | PRN
  Administered 2016-10-26: 4 mg via INTRAVENOUS

## 2016-10-26 MED ORDER — FENTANYL CITRATE (PF) 100 MCG/2ML IJ SOLN
INTRAMUSCULAR | Status: DC | PRN
  Administered 2016-10-26: 100 ug via INTRAVENOUS
  Administered 2016-10-26: 50 ug via INTRAVENOUS
  Administered 2016-10-26: 100 ug via INTRAVENOUS

## 2016-10-26 MED ORDER — SODIUM CHLORIDE 0.9% FLUSH: 9.0000 mL | INTRAVENOUS | Status: DC | PRN

## 2016-10-26 MED ORDER — MEPERIDINE HCL 25 MG/ML IJ SOLN: 6.2500 mg | INTRAMUSCULAR | Status: DC | PRN

## 2016-10-26 MED ORDER — IBUPROFEN 600 MG PO TABS
600.0000 mg | ORAL_TABLET | Freq: Four times a day (QID) | ORAL | Status: DC | PRN
  Administered 2016-10-27 – 2016-10-28 (×4): 600 mg via ORAL
  Filled 2016-10-26 (×4): qty 1

## 2016-10-26 MED ORDER — SUCCINYLCHOLINE CHLORIDE 200 MG/10ML IV SOSY
PREFILLED_SYRINGE | INTRAVENOUS | Status: AC
  Filled 2016-10-26: qty 10

## 2016-10-26 MED ORDER — KETOROLAC TROMETHAMINE 30 MG/ML IJ SOLN
INTRAMUSCULAR | Status: AC
  Filled 2016-10-26: qty 1

## 2016-10-26 MED ORDER — DIPHENHYDRAMINE HCL 12.5 MG/5ML PO ELIX: 12.5000 mg | ORAL_SOLUTION | Freq: Four times a day (QID) | ORAL | Status: DC | PRN

## 2016-10-26 MED ORDER — DIPHENHYDRAMINE HCL 50 MG/ML IJ SOLN: 12.5000 mg | Freq: Four times a day (QID) | INTRAMUSCULAR | Status: DC | PRN

## 2016-10-26 MED ORDER — SIMETHICONE 80 MG PO CHEW
80.0000 mg | CHEWABLE_TABLET | Freq: Four times a day (QID) | ORAL | Status: DC | PRN
  Administered 2016-10-27: 80 mg via ORAL
  Filled 2016-10-26: qty 1

## 2016-10-26 MED ORDER — FENTANYL CITRATE (PF) 100 MCG/2ML IJ SOLN: 25.0000 ug | INTRAMUSCULAR | Status: DC | PRN

## 2016-10-26 MED ORDER — LIDOCAINE HCL (CARDIAC) 20 MG/ML IV SOLN
INTRAVENOUS | Status: AC
  Filled 2016-10-26: qty 5

## 2016-10-26 MED ORDER — LACTATED RINGERS IV SOLN: INTRAVENOUS | Status: DC

## 2016-10-26 MED ORDER — SCOPOLAMINE 1 MG/3DAYS TD PT72
1.0000 | MEDICATED_PATCH | Freq: Once | TRANSDERMAL | Status: DC
  Administered 2016-10-26: 1.5 mg via TRANSDERMAL

## 2016-10-26 MED ORDER — ALUM & MAG HYDROXIDE-SIMETH 200-200-20 MG/5ML PO SUSP: 30.0000 mL | ORAL | Status: DC | PRN

## 2016-10-26 MED ORDER — GLYCOPYRROLATE 0.2 MG/ML IJ SOLN
INTRAMUSCULAR | Status: DC | PRN
  Administered 2016-10-26: 0.2 mg via INTRAVENOUS

## 2016-10-26 MED ORDER — HYDROMORPHONE HCL 1 MG/ML IJ SOLN
INTRAMUSCULAR | Status: DC | PRN
  Administered 2016-10-26: 1 mg via INTRAVENOUS

## 2016-10-26 MED ORDER — HYDROMORPHONE HCL 1 MG/ML IJ SOLN
INTRAMUSCULAR | Status: AC
  Filled 2016-10-26: qty 1

## 2016-10-26 MED ORDER — CEFOTETAN DISODIUM-DEXTROSE 2-2.08 GM-% IV SOLR
INTRAVENOUS | Status: AC
Start: 2016-10-26 — End: 2016-10-26
  Filled 2016-10-26: qty 50

## 2016-10-26 MED ORDER — LIDOCAINE HCL (CARDIAC) 20 MG/ML IV SOLN
INTRAVENOUS | Status: DC | PRN
  Administered 2016-10-26: 50 mg via INTRAVENOUS

## 2016-10-26 MED ORDER — SUGAMMADEX SODIUM 200 MG/2ML IV SOLN
INTRAVENOUS | Status: DC | PRN
  Administered 2016-10-26: 200 mg via INTRAVENOUS

## 2016-10-26 MED ORDER — IRBESARTAN 150 MG PO TABS
150.0000 mg | ORAL_TABLET | Freq: Every day | ORAL | Status: DC
  Administered 2016-10-27: 150 mg via ORAL
  Filled 2016-10-26 (×2): qty 1

## 2016-10-26 MED ORDER — HYDROMORPHONE HCL 1 MG/ML IJ SOLN: 0.2000 mg | INTRAMUSCULAR | Status: DC | PRN

## 2016-10-26 MED ORDER — MIDAZOLAM HCL 2 MG/2ML IJ SOLN
INTRAMUSCULAR | Status: DC | PRN
  Administered 2016-10-26: 2 mg via INTRAVENOUS

## 2016-10-26 MED ORDER — DEXAMETHASONE SODIUM PHOSPHATE 10 MG/ML IJ SOLN
INTRAMUSCULAR | Status: DC | PRN
  Administered 2016-10-26: 4 mg via INTRAVENOUS

## 2016-10-26 MED ORDER — METOPROLOL SUCCINATE ER 25 MG PO TB24
25.0000 mg | ORAL_TABLET | Freq: Two times a day (BID) | ORAL | Status: DC
  Administered 2016-10-26 – 2016-10-28 (×4): 25 mg via ORAL
  Filled 2016-10-26 (×5): qty 1

## 2016-10-26 MED ORDER — SUGAMMADEX SODIUM 200 MG/2ML IV SOLN
INTRAVENOUS | Status: AC
  Filled 2016-10-26: qty 2

## 2016-10-26 MED ORDER — MIDAZOLAM HCL 2 MG/2ML IJ SOLN
INTRAMUSCULAR | Status: AC
  Filled 2016-10-26: qty 2

## 2016-10-26 MED ORDER — ALPRAZOLAM 0.5 MG PO TABS
0.2500 mg | ORAL_TABLET | Freq: Every evening | ORAL | Status: DC | PRN
  Administered 2016-10-27 (×2): 0.25 mg via ORAL
  Filled 2016-10-26 (×2): qty 1

## 2016-10-26 MED ORDER — ONDANSETRON HCL 4 MG/2ML IJ SOLN
INTRAMUSCULAR | Status: AC
  Filled 2016-10-26: qty 2

## 2016-10-26 MED ORDER — DEXTROSE 5 % IV SOLN
INTRAVENOUS | Status: DC | PRN
  Administered 2016-10-26: 2 g via INTRAVENOUS

## 2016-10-26 MED ORDER — DEXTROSE-NACL 5-0.45 % IV SOLN
INTRAVENOUS | Status: DC
  Administered 2016-10-26 (×2): via INTRAVENOUS

## 2016-10-26 MED ORDER — FENTANYL CITRATE (PF) 250 MCG/5ML IJ SOLN
INTRAMUSCULAR | Status: AC
  Filled 2016-10-26: qty 5

## 2016-10-26 MED ORDER — FENTANYL CITRATE (PF) 100 MCG/2ML IJ SOLN
INTRAMUSCULAR | Status: AC
  Filled 2016-10-26: qty 2

## 2016-10-26 MED ORDER — NALOXONE HCL 0.4 MG/ML IJ SOLN: 0.4000 mg | INTRAMUSCULAR | Status: DC | PRN

## 2016-10-26 MED ORDER — LACTATED RINGERS IV SOLN
INTRAVENOUS | Status: DC
  Administered 2016-10-26 (×3): via INTRAVENOUS

## 2016-10-26 MED ORDER — ONDANSETRON HCL 4 MG/2ML IJ SOLN
4.0000 mg | Freq: Four times a day (QID) | INTRAMUSCULAR | Status: DC | PRN
  Administered 2016-10-26: 4 mg via INTRAVENOUS
  Filled 2016-10-26: qty 2

## 2016-10-26 MED ORDER — METOCLOPRAMIDE HCL 5 MG/ML IJ SOLN: 10.0000 mg | Freq: Once | INTRAMUSCULAR | Status: DC | PRN

## 2016-10-26 MED ORDER — SCOPOLAMINE 1 MG/3DAYS TD PT72
MEDICATED_PATCH | TRANSDERMAL | Status: AC
  Administered 2016-10-26: 1.5 mg via TRANSDERMAL
  Filled 2016-10-26: qty 1

## 2016-10-26 MED ORDER — DEXAMETHASONE SODIUM PHOSPHATE 4 MG/ML IJ SOLN
INTRAMUSCULAR | Status: AC
  Filled 2016-10-26: qty 1

## 2016-10-26 MED ORDER — HYDROMORPHONE 1 MG/ML IV SOLN
INTRAVENOUS | Status: DC
  Administered 2016-10-26: 3 mg via INTRAVENOUS
  Administered 2016-10-26: 12:00:00 via INTRAVENOUS
  Administered 2016-10-26: 0.2 mg via INTRAVENOUS
  Administered 2016-10-26: 3 mg via INTRAVENOUS
  Administered 2016-10-27: 0 mg via INTRAVENOUS
  Filled 2016-10-26: qty 5

## 2016-10-26 MED ORDER — PROPOFOL 10 MG/ML IV BOLUS
INTRAVENOUS | Status: DC | PRN
Start: 2016-10-26 — End: 2016-10-26
  Administered 2016-10-26: 150 mg via INTRAVENOUS

## 2016-10-26 MED ORDER — ROCURONIUM BROMIDE 100 MG/10ML IV SOLN
INTRAVENOUS | Status: AC
Start: 2016-10-26 — End: 2016-10-26
  Filled 2016-10-26: qty 1

## 2016-10-26 MED ORDER — OXYCODONE-ACETAMINOPHEN 5-325 MG PO TABS
1.0000 | ORAL_TABLET | ORAL | Status: DC | PRN
  Filled 2016-10-26: qty 1

## 2016-10-26 MED ORDER — MENTHOL 3 MG MT LOZG: 1.0000 | LOZENGE | OROMUCOSAL | Status: DC | PRN

## 2016-10-26 MED ORDER — PROPOFOL 10 MG/ML IV BOLUS
INTRAVENOUS | Status: AC
  Filled 2016-10-26: qty 20

## 2016-10-26 SURGICAL SUPPLY — 37 items
CANISTER SUCT 3000ML PPV (MISCELLANEOUS) ×3 IMPLANT
CLOTH BEACON ORANGE TIMEOUT ST (SAFETY) ×3 IMPLANT
DRAPE WARM FLUID 44X44 (DRAPE) ×2 IMPLANT
DRSG OPSITE POSTOP 4X10 (GAUZE/BANDAGES/DRESSINGS) ×3 IMPLANT
DURAPREP 26ML APPLICATOR (WOUND CARE) ×3 IMPLANT
GAUZE SPONGE 4X4 16PLY XRAY LF (GAUZE/BANDAGES/DRESSINGS) IMPLANT
GLOVE BIOGEL PI IND STRL 7.0 (GLOVE) ×3 IMPLANT
GLOVE BIOGEL PI INDICATOR 7.0 (GLOVE) ×6
GLOVE SURG ORTHO 8.0 STRL STRW (GLOVE) ×3 IMPLANT
GOWN STRL REUS W/TWL LRG LVL3 (GOWN DISPOSABLE) ×9 IMPLANT
HEMOSTAT ARISTA ABSORB 3G PWDR (MISCELLANEOUS) ×2 IMPLANT
LIGASURE IMPACT 36 18CM CVD LR (INSTRUMENTS) ×3 IMPLANT
NEEDLE HYPO 22GX1.5 SAFETY (NEEDLE) ×2 IMPLANT
NS IRRIG 1000ML POUR BTL (IV SOLUTION) ×3 IMPLANT
PACK ABDOMINAL GYN (CUSTOM PROCEDURE TRAY) ×3 IMPLANT
PAD OB MATERNITY 4.3X12.25 (PERSONAL CARE ITEMS) ×3 IMPLANT
PENCIL SMOKE EVAC W/HOLSTER (ELECTROSURGICAL) ×3 IMPLANT
PROTECTOR NERVE ULNAR (MISCELLANEOUS) ×3 IMPLANT
RETRACTOR WND ALEXIS 25 LRG (MISCELLANEOUS) IMPLANT
RTRCTR WOUND ALEXIS 25CM LRG (MISCELLANEOUS)
SPONGE LAP 18X18 X RAY DECT (DISPOSABLE) ×2 IMPLANT
SUT MNCRL 0 MO-4 VIOLET 18 CR (SUTURE) ×1 IMPLANT
SUT MNCRL 0 VIOLET 6X18 (SUTURE) ×1 IMPLANT
SUT MNCRL AB 3-0 PS2 27 (SUTURE) ×3 IMPLANT
SUT MONOCRYL 0 6X18 (SUTURE) ×2
SUT MONOCRYL 0 MO 4 18  CR/8 (SUTURE) ×2
SUT PDS AB 0 CT1 27 (SUTURE) IMPLANT
SUT PDS AB 0 CTX 60 (SUTURE) IMPLANT
SUT PLAIN 3 0 CT 1 27 (SUTURE) ×3 IMPLANT
SUT VIC AB 0 CT1 18XCR BRD8 (SUTURE) IMPLANT
SUT VIC AB 0 CT1 8-18 (SUTURE)
SUT VIC AB 1 CTX 36 (SUTURE) ×3
SUT VIC AB 1 CTX36XBRD ANBCTRL (SUTURE) IMPLANT
SUT VIC AB 2-0 CT1 (SUTURE) ×3 IMPLANT
SYR CONTROL 10ML LL (SYRINGE) ×2 IMPLANT
TOWEL OR 17X24 6PK STRL BLUE (TOWEL DISPOSABLE) ×6 IMPLANT
TRAY FOLEY CATH SILVER 14FR (SET/KITS/TRAYS/PACK) ×3 IMPLANT

## 2016-10-26 NOTE — Transfer of Care (Signed)
Immediate Anesthesia Transfer of Care Note  Patient: Kathy Garrett  Procedure(s) Performed: Procedure(s): HYSTERECTOMY ABDOMINAL WITH SALPINGECTOMY (Bilateral)  Patient Location: PACU  Anesthesia Type:General  Level of Consciousness: awake, alert  and oriented  Airway & Oxygen Therapy: Patient Spontanous Breathing and Patient connected to nasal cannula oxygen  Post-op Assessment: Report given to RN and Post -op Vital signs reviewed and stable  Post vital signs: Reviewed and stable  Last Vitals:  Vitals:   10/26/16 0612  BP: (!) 130/95  Pulse: 87  Resp: 16  Temp: 36.7 C    Last Pain:  Vitals:   10/26/16 0612  TempSrc: Oral      Patients Stated Pain Goal: 4 (45/99/77 4142)  Complications: No apparent anesthesia complications

## 2016-10-26 NOTE — Brief Op Note (Signed)
10/26/2016  8:41 AM  PATIENT:  Kathy Garrett  46 y.o. female  PRE-OPERATIVE DIAGNOSIS:  enlarging fibroids, 12 week size uterus, pelvic pain, menorrhagia  POST-OPERATIVE DIAGNOSIS:  enlarging fibroids, 12 week size uterus, pelvic pain, menorrhagia  PROCEDURE:  Procedure(s): HYSTERECTOMY ABDOMINAL WITH SALPINGECTOMY (Bilateral)  SURGEON:  Surgeon(s) and Role:    Louretta Shorten, MD - Primary    * Maisie Fus, MD - Assisting  PHYSICIAN ASSISTANT:   ASSISTANTS: neal   ANESTHESIA:   general  EBL:  Total I/O In: 1000 [I.V.:1000] Out: 450 [Urine:250; Blood:200]  BLOOD ADMINISTERED:none  DRAINS: Urinary Catheter (Foley)   LOCAL MEDICATIONS USED:  NONE  SPECIMEN:  Source of Specimen:  uterus and bil tubes  DISPOSITION OF SPECIMEN:  PATHOLOGY  COUNTS:  YES  TOURNIQUET:  * No tourniquets in log *  DICTATION: .Dragon Dictation  PLAN OF CARE: Admit to inpatient   PATIENT DISPOSITION:  PACU - hemodynamically stable.   Delay start of Pharmacological VTE agent (>24hrs) due to surgical blood loss or risk of bleeding: not applicable

## 2016-10-26 NOTE — Op Note (Signed)
Uterus, cervix and fallopian tubes wt 513.3 grams

## 2016-10-26 NOTE — Anesthesia Procedure Notes (Signed)
Procedure Name: Intubation Date/Time: 10/26/2016 7:46 AM Performed by: Jonna Munro Pre-anesthesia Checklist: Patient identified, Emergency Drugs available, Suction available, Patient being monitored and Timeout performed Patient Re-evaluated:Patient Re-evaluated prior to inductionOxygen Delivery Method: Circle system utilized Preoxygenation: Pre-oxygenation with 100% oxygen Intubation Type: IV induction Ventilation: Mask ventilation without difficulty Laryngoscope Size: Mac and 3 Grade View: Grade I Tube type: Oral Tube size: 7.0 mm Number of attempts: 1 Airway Equipment and Method: Stylet Placement Confirmation: ETT inserted through vocal cords under direct vision,  positive ETCO2 and breath sounds checked- equal and bilateral Secured at: 21 cm Tube secured with: Tape Dental Injury: Teeth and Oropharynx as per pre-operative assessment

## 2016-10-26 NOTE — Anesthesia Preprocedure Evaluation (Signed)
Anesthesia Evaluation  Patient identified by MRN, date of birth, ID band Patient awake    Reviewed: Allergy & Precautions, NPO status , Patient's Chart, lab work & pertinent test results  History of Anesthesia Complications (+) PONV  Airway Mallampati: II  TM Distance: >3 FB Neck ROM: Full    Dental no notable dental hx.    Pulmonary neg pulmonary ROS,    Pulmonary exam normal breath sounds clear to auscultation       Cardiovascular hypertension, Pt. on medications and Pt. on home beta blockers Normal cardiovascular exam Rhythm:Regular Rate:Normal     Neuro/Psych negative neurological ROS  negative psych ROS   GI/Hepatic negative GI ROS, Neg liver ROS,   Endo/Other  negative endocrine ROS  Renal/GU negative Renal ROS  negative genitourinary   Musculoskeletal negative musculoskeletal ROS (+)   Abdominal   Peds negative pediatric ROS (+)  Hematology negative hematology ROS (+)   Anesthesia Other Findings   Reproductive/Obstetrics negative OB ROS                            Anesthesia Physical Anesthesia Plan  ASA: II  Anesthesia Plan: General   Post-op Pain Management:    Induction: Intravenous  PONV Risk Score and Plan: 4 or greater and Ondansetron, Dexamethasone, Propofol, Midazolam and Scopolamine patch - Pre-op  Airway Management Planned: Oral ETT  Additional Equipment:   Intra-op Plan:   Post-operative Plan: Extubation in OR  Informed Consent: I have reviewed the patients History and Physical, chart, labs and discussed the procedure including the risks, benefits and alternatives for the proposed anesthesia with the patient or authorized representative who has indicated his/her understanding and acceptance.   Dental advisory given  Plan Discussed with: CRNA  Anesthesia Plan Comments:         Anesthesia Quick Evaluation

## 2016-10-26 NOTE — H&P (Signed)
:    Kathy Garrett presents today for preop evaluation for fibroids, menorrhagia and pelvic pain.  She has a history of fibroids and has had myomectomies in the past.  Recent ultrasound in November shows multiple fibroids including the largest 5.4 cm, 3.5, 3.2, 3.0, 2.4 and 2.7 cm.  They have grown several centimeters over the last several years.  She has a 12-week sized fibroid uterus, having significant menorrhagia, pelvic pain and recurrence of the fibroids.   O:  Physical exam:  Heart is regular rate and rhythm.  Lungs are clear to auscultation bilaterally.  Abdomen is nondistended, nontender.  The uterus is palpable to 2 to 3 fingerbreadths above the pubic symphysis.  Incision is well healed.   PAST MEDICAL HISTORY:  Significant for hypertension and anxiety.  She has had multiple surgeries in the past including myomectomies and has done well with these including a cesarean section.   ALLERGIES:   She has no known drug allergies.   She did have a recent night pulse oximetry that showed that she desaturated 3 times and was supposed to let Anesthesia know this.  A/P:  Fibroids, menorrhagia, menometorrhagia and pelvic pain.  Plan total abdominal hysterectomy/bilateral salpingectomy.  Discussed the pros and cons and risks and benefits at length.  All of her questions were answered.  She has given informed consent.   This patient has been seen and examined.   All of her questions were answered.  Labs and vital signs reviewed.  Informed consent has been obtained.  The History and Physical is current. 10/26/16 DL

## 2016-10-26 NOTE — Op Note (Signed)
NAMEFRANCI, OSHANA NO.:  [id number]  MEDICAL RECORD NO.:  [id number]  LOCATION:                                 FACILITY:  PHYSICIAN:  Monia Sabal. Corinna Capra, M.D.         DATE OF BIRTH:  DATE OF PROCEDURE:  10/26/2016 DATE OF DISCHARGE:                              OPERATIVE REPORT   PREOPERATIVE DIAGNOSIS:  Enlarging fibroids, 12-week size uterus, pelvic pain, menorrhagia.  POSTOPERATIVE DIAGNOSIS:  Enlarging fibroids, 12-week size uterus, pelvic pain, menorrhagia.  PROCEDURE:  Abdominal hysterectomy with bilateral salpingectomy.  SURGEON:  Monia Sabal. Corinna Capra, M.D.  ASSISTANTMaisie Fus, M.D.  ANESTHESIA:  General endotracheal.  INDICATIONS:  Ms. Kathy Garrett is a 46 year old black female with worsening pelvic pain, menorrhagia, abnormal bleeding, and history of fibroids. She has had several myomectomies in the past, now has enlarging 5.6 cm with the largest measuring 5.4 cm, 3.5 cm, 3.2 cm, 3.0 cm, 2.4 cm, and 2.7 cm.  Due to pain, pressure, and menorrhagia, she desires definitive surgical removal and planned hysterectomy.  Risks and benefits of procedure were discussed at length.  Informed consent was obtained.  FINDINGS:  Findings at the time of surgery were enlarged uterus consistent with fibroids, approximately 12-week size.  Normal-appearing ovaries and appendix.  DESCRIPTION OF PROCEDURE:  After adequate analgesia, the patient was placed in the supine position.  She was sterilely prepped and draped. Foley catheter was sterilely placed.  A Pfannenstiel skin incision was made 2 fingerbreadths above the pubic symphysis, taken down sharply to fascia, which was incised transversely, extended superior and inferiorly off the bellies of the rectus muscle, which was separated sharply in midline.  Peritoneum was entered sharply.  Bowel was packed cephalad. O'Connor-O'Sullivan retractor was placed.  Uterus elevated with a thyroid tenaculum.  LigaSure instrument was  used to ligate across the round ligaments bilaterally, opening the broad ligaments bilaterally and clamping across the utero-ovarian ligaments and across the mesosalpinx removing the fallopian tubes.  The bladder was dissected off the anterior surface of the cervix and LigaSure instrument was used to ligate across the uterine artery bilaterally down across the cardinal ligaments down to the uterosacral ligaments.  Heaney clamp was used to clamp across the uterosacral ligaments, and the uterus and cervix were used and removed intact.  The 0 Monocryl suture was used to secure the angles of the vagina as well as the uterosacral ligaments.  Vagina was then closed with figure-of-eights to 0 Monocryl suture.  The uterosacral ligaments were then plicated in the midline with good support. Approximation noted after copious amount of irrigation and adequate hemostasis was assured.  There was some small bleeding from the dissection of the bladder off the anterior surface of the cervix and small amount of Arista was applied with good hemostasis achieved. Packing was then removed.  The peritoneum was then closed with a 2-0 Vicryl suture.  Rectus muscle plicated in the midline.  The fascia was then closed with a #1 Vicryl.  The previous skin incision was removed and the skin was then closed with a subcuticular suture of 3-0 Monocryl.  With good approximation, good hemostasis, a  honeycomb dressing was applied.  The patient tolerated the procedure well, was stable on transfer to the recovery room.  Sponge and instrument count was normal x3.  Estimated blood loss was 200 mL.  The patient received 2 g of cefotetan preoperatively.     Monia Sabal Corinna Capra, M.D.     DCL/MEDQ  D:  10/26/2016  T:  10/26/2016  Job:  [id number]

## 2016-10-27 ENCOUNTER — Encounter (HOSPITAL_COMMUNITY): Payer: Self-pay | Admitting: Obstetrics and Gynecology

## 2016-10-27 LAB — CBC
HEMATOCRIT: 33.1 % — AB (ref 36.0–46.0)
HEMOGLOBIN: 11.4 g/dL — AB (ref 12.0–15.0)
MCH: 30.9 pg (ref 26.0–34.0)
MCHC: 34.4 g/dL (ref 30.0–36.0)
MCV: 89.7 fL (ref 78.0–100.0)
Platelets: 210 10*3/uL (ref 150–400)
RBC: 3.69 MIL/uL — ABNORMAL LOW (ref 3.87–5.11)
RDW: 13.3 % (ref 11.5–15.5)
WBC: 12.1 10*3/uL — ABNORMAL HIGH (ref 4.0–10.5)

## 2016-10-27 NOTE — Anesthesia Postprocedure Evaluation (Signed)
Anesthesia Post Note  Patient: Kathy Garrett  Procedure(s) Performed: Procedure(s) (LRB): HYSTERECTOMY ABDOMINAL WITH SALPINGECTOMY (Bilateral)     Patient location during evaluation: PACU Anesthesia Type: General Level of consciousness: awake and alert Pain management: pain level controlled Vital Signs Assessment: post-procedure vital signs reviewed and stable Respiratory status: spontaneous breathing, nonlabored ventilation, respiratory function stable and patient connected to nasal cannula oxygen Cardiovascular status: blood pressure returned to baseline and stable Postop Assessment: no signs of nausea or vomiting Anesthetic complications: no    Last Vitals:  Vitals:   10/27/16 0731 10/27/16 1100  BP: 126/76 124/69  Pulse: 78 67  Resp: 18 17  Temp: 37.1 C 37 C    Last Pain:  Vitals:   10/27/16 1220  TempSrc:   PainSc: 3                  Montez Hageman

## 2016-10-27 NOTE — Progress Notes (Signed)
1 Day Post-Op Procedure(s) (LRB): HYSTERECTOMY ABDOMINAL WITH SALPINGECTOMY (Bilateral)  Subjective: Patient reports tolerating PO and no problems voiding.    Objective: I have reviewed patient's vital signs, intake and output, medications and labs.  General: alert, cooperative, appears stated age and no distress GI: soft, non-tender; bowel sounds normal; no masses,  no organomegaly and incision: clean, dry and intact  Assessment: s/p Procedure(s): HYSTERECTOMY ABDOMINAL WITH SALPINGECTOMY (Bilateral): stable and progressing well  Plan: Advance diet Encourage ambulation Advance to PO medication Discontinue IV fluids  LOS: 1 day    Shannette Tabares C 10/27/2016, 9:48 AM

## 2016-10-28 NOTE — Progress Notes (Signed)
2 Days Post-Op Procedure(s) (LRB): HYSTERECTOMY ABDOMINAL WITH SALPINGECTOMY (Bilateral)  Subjective: Patient reports tolerating PO, + flatus and no problems voiding.    Objective: I have reviewed patient's vital signs, intake and output, medications and labs.  General: alert, cooperative, appears stated age and no distress GI: soft, non-tender; bowel sounds normal; no masses,  no organomegaly Extremities: extremities normal, atraumatic, no cyanosis or edema  Assessment: s/p Procedure(s): HYSTERECTOMY ABDOMINAL WITH SALPINGECTOMY (Bilateral): stable  Plan: Discharge home  LOS: 2 days    Joni Norrod C 10/28/2016, 9:14 AM

## 2016-10-28 NOTE — Discharge Summary (Signed)
Physician Discharge Summary  Patient ID: Kathy Garrett MRN: [id number] DOB/AGE: 1971-03-24 46 y.o.  Admit date: 10/26/2016 Discharge date: 10/28/2016  Admission Diagnoses:aub, pelvic pain, fibroids  Discharge Diagnoses: same Active Problems:   S/P TAH (total abdominal hysterectomy)   Discharged Condition: good  Hospital Course: Pt underwent an uncomplicated TAB, BSO.  Her post op care was unremarkable with quick return of bowel and bladder function.  On POD 1 was tolerating regular diet and po meds.  POD 2 only taking occasional motrin and was doing well, and d/c'd home   Consults: None  Significant Diagnostic Studies: labs: pod 1 hgb 11.4  Treatments: surgery: TAH,BSO  Discharge Exam: Blood pressure 120/82, pulse 77, temperature 98.2 F (36.8 C), temperature source Oral, resp. rate 16, height 5' 2.5" (1.588 m), weight 142 lb 6 oz (64.6 kg), SpO2 100 %. General appearance: alert, cooperative, appears stated age and no distress GI: soft, non-tender; bowel sounds normal; no masses,  no organomegaly Incision/Wound:CD&I  Disposition:   Discharge Instructions    Call MD for:  difficulty breathing, headache or visual disturbances    Complete by:  As directed    Call MD for:  persistant nausea and vomiting    Complete by:  As directed    Call MD for:  redness, tenderness, or signs of infection (pain, swelling, redness, odor or green/yellow discharge around incision site)    Complete by:  As directed    Call MD for:  severe uncontrolled pain    Complete by:  As directed    Call MD for:  temperature >100.4    Complete by:  As directed    Diet general    Complete by:  As directed    Driving Restrictions    Complete by:  As directed    No driving for 2 weeks   Increase activity slowly    Complete by:  As directed    Lifting restrictions    Complete by:  As directed    No lifting anything greater than 10 pounds (if you have to ask, don't lift it)   Sexual Activity Restrictions     Complete by:  As directed    Nothing in the vagina for 6 weeks     Allergies as of 10/28/2016   No Known Allergies     Medication List    TAKE these medications   ALPRAZolam 0.25 MG tablet Commonly known as:  XANAX Take 1 tablet by mouth at bedtime as needed for anxiety.   metoprolol succinate 50 MG 24 hr tablet Commonly known as:  TOPROL-XL Take 25 mg by mouth 2 (two) times daily. Take with or immediately following a meal.   valsartan 160 MG tablet Commonly known as:  DIOVAN Take 160 mg by mouth daily.        Signed: Mansa Willers C 10/28/2016, 9:15 AM

## 2016-11-25 ENCOUNTER — Encounter: Payer: Self-pay | Admitting: Neurology

## 2016-11-25 ENCOUNTER — Ambulatory Visit (INDEPENDENT_AMBULATORY_CARE_PROVIDER_SITE_OTHER): Payer: BC Managed Care – PPO | Admitting: Neurology

## 2016-11-25 VITALS — BP 124/89 | HR 93 | Ht 62.5 in | Wt 137.0 lb

## 2016-11-25 DIAGNOSIS — G4719 Other hypersomnia: Secondary | ICD-10-CM | POA: Diagnosis not present

## 2016-11-25 DIAGNOSIS — G4734 Idiopathic sleep related nonobstructive alveolar hypoventilation: Secondary | ICD-10-CM | POA: Diagnosis not present

## 2016-11-25 DIAGNOSIS — R0683 Snoring: Secondary | ICD-10-CM | POA: Diagnosis not present

## 2016-11-25 DIAGNOSIS — G478 Other sleep disorders: Secondary | ICD-10-CM | POA: Diagnosis not present

## 2016-11-25 DIAGNOSIS — Z820 Family history of epilepsy and other diseases of the nervous system: Secondary | ICD-10-CM | POA: Diagnosis not present

## 2016-11-25 NOTE — Progress Notes (Signed)
Subjective:    Patient ID: Kathy Garrett is a 46 y.o. female.  HPI     Star Age, MD, PhD [facility] [address] [place], [place] [address]  Dear Dr. Philip Aspen,   I saw your patient, Kathy Garrett, upon your kind request in my neurologic clinic today for initial consultation of her sleep disorder. The patient is unaccompanied today. As you know, Ms. Polkowski is a 46 year old right-handed woman with an underlying medical history of hypertension, bronchitis, anemia, recent status post abdominal hysterectomy, who reports difficulty with sleep maintenance. She seems to wake up consistently in the early morning hours with inability to go back to sleep often. She does snore per husband. He does not typically sleep in the same bedroom as he has obstructive sleep apnea and does not always use a CPAP machine and his snoring bothers her. I reviewed your office note from 11/19/16, which you kindly included. She had a recent ONO through your office on 10/06/16, which I reviewed: Duration was 8 h and 6 min, average O2 saturation 95.9%, nadir of 83%, time below 88% saturation of less than 1 min. she lives with her husband and her daughter. She works for [employer]. She is a nonsmoker and does not drink alcohol or use illicit drugs and does not utilize caffeine on a regular basis. Her Epworth sleepiness score is 8 out of 24, fatigue score is 34 out of 63. Her main complaint is difficulty maintaining sleep, not waking up rested. Of note, her mother has sleep apnea, her sister has also been told she has mild sleep apnea. The patient has issues with tooth grinding at night but no significant TMJ issues, recently saw her dentist. 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 She does watch TV in her bed and keeps it on a timer at night. She  has tried over-the-counter chewable melatonin but did not do well with it, had nausea with it. Since December 2017 she has been using Xanax at night as needed, not nightly, but typically about 3 nights a week 0.25 mg strength. It does help her sleep at night and relax at night. She denies telltale symptoms of restless legs syndrome, has no night to night nocturia and typically no morning headaches. Her bedtime is around 10 PM, wakeup time around 4:15 AM. She does not have any significant difficulty falling asleep. She is a little worried about the abnormal pulse oximetry study from May 2018.  Her Past Medical History Is Significant For: Past Medical History:  Diagnosis Date  . Anemia    years ago  . Bronchitis 06/2016  . Glaucoma   . Hyperlipidemia   . Hypertension   . Peripheral vascular disease (HCC)    right leg  . PONV (postoperative nausea and vomiting)   . Sleep apnea    home study done needs overnight study    Her Past Surgical History Is Significant For: Past Surgical History:  Procedure Laterality Date  . CESAREAN SECTION  04/06/2010  . DIAGNOSTIC LAPAROSCOPY     08/2008  . HYSTERECTOMY ABDOMINAL WITH SALPINGECTOMY Bilateral 10/26/2016   Procedure: HYSTERECTOMY ABDOMINAL WITH SALPINGECTOMY;  Surgeon: Louretta Shorten, MD;  Location: [facility];  Service: Gynecology;  Laterality: Bilateral;  . MYOMECTOMY  09/1999  . MYOMECTOMY  03/2007  . WISDOM TOOTH EXTRACTION  Her Family History Is Significant For: No family history on file.  Her Social History Is Significant For: Social History   Social History  . Marital status: Married    Spouse name: N/A  . Number of children: N/A  . Years of education: N/A   Social History Main Topics  . Smoking status: Never Smoker  . Smokeless tobacco: Never Used  . Alcohol use No  . Drug use: No  . Sexual activity: Yes    Birth control/ protection: Pill   Other Topics Concern  . None   Social History Narrative  . None    Her  Allergies Are:  No Known Allergies:   Her Current Medications Are:  Outpatient Encounter Prescriptions as of 11/25/2016  Medication Sig  . ALPRAZolam (XANAX) 0.25 MG tablet Take 1 tablet by mouth at bedtime as needed for anxiety.  . Calcium-Vitamin D-Vitamin K (VIACTIV PO) Take by mouth.  . metoprolol succinate (TOPROL-XL) 50 MG 24 hr tablet Take 25 mg by mouth 2 (two) times daily. Take with or immediately following a meal.  . valsartan (DIOVAN) 160 MG tablet Take 160 mg by mouth daily.  . [DISCONTINUED] albuterol (PROVENTIL HFA;VENTOLIN HFA) 108 (90 Base) MCG/ACT inhaler Inhale 2 puffs into the lungs every 6 (six) hours as needed for wheezing or shortness of breath.  . [DISCONTINUED] fluticasone (FLONASE) 50 MCG/ACT nasal spray Place 2 sprays into both nostrils daily.  . [DISCONTINUED] norethindrone-ethinyl estradiol (JUNEL FE,GILDESS FE,LOESTRIN FE) 1-20 MG-MCG tablet Take 1 tablet by mouth daily.  . [DISCONTINUED] promethazine-codeine (PHENERGAN WITH CODEINE) 6.25-10 MG/5ML syrup Take 5 mLs by mouth every 6 (six) hours as needed for cough.   No facility-administered encounter medications on file as of 11/25/2016.   :  Review of Systems:  Out of a complete 14 point review of systems, all are reviewed and negative with the exception of these symptoms as listed below: Review of Systems  Neurological:       Pt presents today to discuss her sleep. Pt has never had a sleep study. Pt does endorse occasional snoring. Pt is bothered by insomnia and wakes up at 3:45am-4:00am consistently, unable to fall asleep.  Epworth Sleepiness Scale 0= would never doze 1= slight chance of dozing 2= moderate chance of dozing 3= high chance of dozing  Sitting and reading: 0 Watching TV: 3 Sitting inactive in a public place (ex. Theater or meeting): 1 As a passenger in a car for an hour without a break: 1 Lying down to rest in the afternoon: 1 Sitting and talking to someone: 0 Sitting quietly after lunch  (no alcohol): 2 In a car, while stopped in traffic: 0 Total: 8     Objective:  Neurological Exam  Physical Exam Physical Examination:   Vitals:   11/25/16 1459  BP: 124/89  Pulse: 93   General Examination: The patient is a very pleasant 46 y.o. female in no acute distress. She appears well-developed and well-nourished and well groomed.   HEENT: Normocephalic, atraumatic, pupils are equal, round and reactive to light and accommodation. Funduscopic exam is normal with sharp disc margins noted. Extraocular tracking is good without limitation to gaze excursion or nystagmus noted. Normal smooth pursuit is noted. Hearing is grossly intact. Tympanic membranes are clear bilaterally. Face is symmetric with normal facial animation and normal facial sensation. Speech is clear with no dysarthria noted. There is no hypophonia. There is no lip, neck/head, jaw or voice tremor. Neck is supple with full range of passive and active  motion. There are no carotid bruits on auscultation. Oropharynx exam reveals: mild mouth dryness, good dental hygiene and no significant airway crowding. Mallampati is class I. Tongue protrudes centrally and palate elevates symmetrically. Tonsils are 1+ in size. Neck size is 13.25 inches. She has a minimal overbite.    Chest: Clear to auscultation without wheezing, rhonchi or crackles noted.  Heart: S1+S2+0, regular and normal without murmurs, rubs or gallops noted.   Abdomen: Soft, non-tender and non-distended with normal bowel sounds appreciated on auscultation.  Extremities: There is no pitting edema in the distal lower extremities bilaterally. Pedal pulses are intact.  Skin: Warm and dry without trophic changes noted. There are no varicose veins.  Musculoskeletal: exam reveals no obvious joint deformities, tenderness or joint swelling or erythema.   Neurologically:  Mental status: The patient is awake, alert and oriented in all 4 spheres. Her immediate and remote  memory, attention, language skills and fund of knowledge are appropriate. There is no evidence of aphasia, agnosia, apraxia or anomia. Speech is clear with normal prosody and enunciation. Thought process is linear. Mood is normal and affect is normal.  Cranial nerves II - XII are as described above under HEENT exam. In addition: shoulder shrug is normal with equal shoulder height noted. Motor exam: Normal bulk, strength and tone is noted. There is no drift, tremor or rebound. Romberg is negative. Reflexes are 2+ throughout. Fine motor skills and coordination: intact with normal finger taps, normal hand movements, normal rapid alternating patting, normal foot taps and normal foot agility.  Cerebellar testing: No dysmetria or intention tremor on finger to nose testing. Heel to shin is unremarkable bilaterally. There is no truncal or gait ataxia.  Sensory exam: intact to light touch in the upper and lower extremities.  Gait, station and balance: She stands easily. No veering to one side is noted. No leaning to one side is noted. Posture is age-appropriate and stance is narrow based. Gait shows normal stride length and normal pace. No problems turning are noted. Tandem walk is unremarkable.   Assessment and Plan:  In summary, NEALA MIGGINS is a very pleasant 46 y.o.-year old female with an underlying medical history of hypertension, bronchitis, anemia, recent status post abdominal hysterectomy, whose history and physical exam are somewhat concerning for obstructive sleep apnea (OSA). While she does not have a telltale history, she does report snoring, sleep disruption, family history of OSA and recent abnormal findings on an overnight pulse oximetry test, this justifies sleep study testing. I had a long chat with the patient about my findings and the diagnosis of OSA, its prognosis and treatment options. We talked about medical treatments, surgical interventions and non-pharmacological approaches. I explained in  particular the risks and ramifications of untreated moderate to severe OSA, especially with respect to developing cardiovascular disease down the Road, including congestive heart failure, difficult to treat hypertension, cardiac arrhythmias, or stroke. Even type 2 diabetes has, in part, been linked to untreated OSA. Symptoms of untreated OSA include daytime sleepiness, memory problems, mood irritability and mood disorder such as depression and anxiety, lack of energy, as well as recurrent headaches, especially morning headaches. We talked about trying to maintain a healthy lifestyle in general, as well as the importance of weight control. I encouraged the patient to eat healthy, exercise daily and keep well hydrated, to keep a scheduled bedtime and wake time routine, to not skip any meals and eat healthy snacks in between meals. I advised the patient not to drive when  feeling sleepy. She is cautioned about the regular use of benzodiazepines but uses Xanax only up to 3 times a week. Furthermore, she is reminded that alcohol is not an appropriate sleep aid. Of note, she does not drink alcohol on a regular basis. We did talk about improvements in her sleep hygiene, particularly avoiding watching TV in her bedroom. Furthermore, she is advised that she could try melatonin again, 1-3 mg, 1-2 hours before projected bedtime.  I recommended the following at this time: sleep study with potential positive airway pressure titration. (We will score hypopneas at 3%).   I explained the sleep test procedure to the patient and also outlined possible surgical and non-surgical treatment options of OSA, including the use of a custom-made dental device (which would require a referral to a specialist dentist or oral surgeon), upper airway surgical options, such as pillar implants, radiofrequency surgery, tongue base surgery, and UPPP (which would involve a referral to an ENT surgeon). Rarely, jaw surgery such as mandibular  advancement may be considered.  I also explained the CPAP treatment option to the patient, who indicated that she would be willing to try CPAP if the need arises. I explained the importance of being compliant with PAP treatment, not only for insurance purposes but primarily to improve Her symptoms, and for the patient's long term health benefit, including to reduce Her cardiovascular risks. I answered all her questions today and the patient was in agreement. I would like to see her back after the sleep study is completed and encouraged her to call with any interim questions, concerns, problems or updates.   Thank you very much for allowing me to participate in the care of this nice patient. If I can be of any further assistance to you please do not hesitate to call me at [phone].  Sincerely,   Star Age, MD, PhD

## 2016-11-25 NOTE — Patient Instructions (Signed)
  You can try Melatonin again at night for sleep: take 1 mg to 3 mg, one to 2 hours before your bedtime. You can go up to 5 mg if needed. It is over the counter and comes in pill form, chewable form and spray, if you prefer.   Please remember to try to maintain good sleep hygiene, which means: Keep a regular sleep and wake schedule, try not to exercise or have a meal within 2 hours of your bedtime, try to keep your bedroom conducive for sleep, that is, cool and dark, without light distractors such as an illuminated alarm clock, and refrain from watching TV right before sleep or in the middle of the night and do not keep the TV or radio on during the night. Also, try not to use or play on electronic devices at bedtime, such as your cell phone, tablet PC or laptop. If you like to read at bedtime on an electronic device, try to dim the background light as much as possible. Do not eat in the middle of the night.

## 2016-12-06 ENCOUNTER — Ambulatory Visit (INDEPENDENT_AMBULATORY_CARE_PROVIDER_SITE_OTHER): Payer: BC Managed Care – PPO | Admitting: Neurology

## 2016-12-06 DIAGNOSIS — G4720 Circadian rhythm sleep disorder, unspecified type: Secondary | ICD-10-CM

## 2016-12-06 DIAGNOSIS — G4710 Hypersomnia, unspecified: Secondary | ICD-10-CM | POA: Diagnosis not present

## 2016-12-06 DIAGNOSIS — R0683 Snoring: Secondary | ICD-10-CM

## 2016-12-08 ENCOUNTER — Telehealth: Payer: Self-pay

## 2016-12-08 NOTE — Progress Notes (Signed)
Patient referred by Dr. Philip Aspen, seen by me on 11/25/16, diagnostic PSG on 12/06/16.   Please call and notify the patient that the recent sleep study did not show any significant obstructive sleep apnea with the exception of mild, intermittent snoring. No significant/intrinsic or organic sleep d/o, which is reassuring, slept about 84% of the time tested, which is fairly good.  For disturbing snoring, an oral appliance (through a qualified dentist) can be considered.  For chronic sleep maintenance problems, as referral to a psychologist for consideration of cognitive behavioral therapy (CBT-I) can be considered - she may want to discuss with PCP. Please remind patient to try to maintain good sleep hygiene, which means: Keep a regular sleep and wake schedule and make enough time for sleep (7 1/2 to 8 1/2 hours for the average adult), try not to exercise or have a meal within 2 hours of your bedtime, try to keep your bedroom conducive for sleep, that is, cool and dark, without light distractors such as an illuminated alarm clock, and refrain from watching TV right before sleep or in the middle of the night and do not keep the TV or radio on during the night. If a nightlight is used, have it away from the visual field. Also, try not to use or play on electronic devices at bedtime, such as your cell phone, tablet PC or laptop. If you like to read at bedtime on an electronic device, try to dim the background light as much as possible. Do not eat in the middle of the night. Keep pets away from the bedroom environment. For stress relief, try meditation, deep breathing exercises (there are many books and CDs available), a white noise machine or fan can help to diffuse other noise distractors, such as traffic noise. Do not drink alcohol before bedtime, as it can disturb sleep and cause middle of the night awakenings. Never mix alcohol and sedating medications! Avoid narcotic pain medication close to bedtime, as  opioids/narcotics can suppress breathing drive and breathing effort.   She can follow up with Dr. Philip Aspen.   Once you have spoken to patient, you can close this encounter.   Thanks,  Star Age, MD, PhD [facility])

## 2016-12-08 NOTE — Procedures (Signed)
PATIENT'S NAME:  Kathy Garrett, Kathy Garrett DOB:      08-Apr-1971      MR#:    [id number]     DATE OF RECORDING: 12/06/2016 REFERRING M.D.:  Leanna Battles MD Study Performed:   Baseline Polysomnogram HISTORY: 46 year old woman with a history of hypertension, bronchitis, anemia, recent status post abdominal hysterectomy, who reports snoring and difficulty with sleep maintenance. The patient endorsed the Epworth Sleepiness Scale at 8/24 points. The patient's weight 137 pounds with a height of 62 (inches), resulting in a BMI of 24.5 kg/m2. The patient's neck circumference measured 13.5 inches.  CURRENT MEDICATIONS: Alprazolam, Calcium and Vitamin D, Metoprolol Succinate and Valsartan   PROCEDURE:  This is a multichannel digital polysomnogram utilizing the Somnostar 11.2 system.  Electrodes and sensors were applied and monitored per AASM Specifications.   EEG, EOG, Chin and Limb EMG, were sampled at 200 Hz.  ECG, Snore and Nasal Pressure, Thermal Airflow, Respiratory Effort, CPAP Flow and Pressure, Oximetry was sampled at 50 Hz. Digital video and audio were recorded.      BASELINE STUDY  Lights Out was at 22:16 and Lights On at 05:00.  Total recording time (TRT) was 404 minutes, with a total sleep time (TST) of  340 minutes.   The patient's sleep latency was 8 minutes, which is normal.  REM latency was 74 minutes, which is normal. The sleep efficiency was 84.2 %.     SLEEP ARCHITECTURE: WASO (Wake after sleep onset) was 56 minutes with mild sleep fragmentation noted. There were 28.5 minutes in Stage N1, 241 minutes Stage N2, 0 minutes Stage N3 and 70.5 minutes in Stage REM.  The percentage of Stage N1 was 8.4%, which is mildly increased, Stage N2 was 70.9%, which is increased, Stage N3 was absent and Stage R (REM sleep) was 20.7%, which is normal. The arousals were noted as: 26 were spontaneous, 0 were associated with PLMs, 8 were associated with respiratory events.    Audio and video analysis did not show any  abnormal or unusual movements, behaviors, phonations or vocalizations. The patient took 1 bathroom break. Snoring was noted, which was mild and intermittent. The EKG was in keeping with normal sinus rhythm (NSR).  RESPIRATORY ANALYSIS:  There were a total of 8 respiratory events:  0 obstructive apneas, 0 central apneas and 0 mixed apneas with a total of 0 apneas and an apnea index (AI) of 0 /hour. There were 8 hypopneas with a hypopnea index of 1.4 /hour. The patient also had 0 respiratory event related arousals (RERAs).      The total APNEA/HYPOPNEA INDEX (AHI) was 1.4/hour and the total RESPIRATORY DISTURBANCE INDEX was 1.4 /hour.  0 events occurred in REM sleep and 16 events in NREM. The REM AHI was 0 /hour, versus a non-REM AHI of 1.8. The patient spent 145 minutes of total sleep time in the supine position and 195 minutes in non-supine.. The supine AHI was 1.2 versus a non-supine AHI of 1.5.  OXYGEN SATURATION & C02:  The Wake baseline 02 saturation was 96%, with the lowest being 86%. Time spent below 89% saturation equaled 3 minutes (nadir of 65% was error).  PERIODIC LIMB MOVEMENTS: The patient had a total of 0 Periodic Limb Movements.  The Periodic Limb Movement (PLM) index was 0 and the PLM Arousal index was 0/hour.  Post-study, the patient indicated that sleep was the same as usual.   IMPRESSION:  1. Primary Snoring 2. Dysfunctions associated with sleep stages or arousal from sleep  RECOMMENDATIONS:  1. This study does not demonstrate any significant obstructive or central sleep disordered breathing with the exception of mild, intermittent snoring. For disturbing snoring, an oral appliance (through a qualified dentist) can be considered.  2. This study shows sleep fragmentation and abnormal sleep stage percentages; these are nonspecific findings and per se do not signify an intrinsic sleep disorder or a cause for the patient's sleep-related symptoms. Causes include (but are not limited  to) the first night effect of the sleep study, circadian rhythm disturbances, medication effect or an underlying mood disorder or medical problem.  3. The patient should be cautioned not to drive, work at heights, or operate dangerous or heavy equipment when tired or sleepy. Review and reiteration of good sleep hygiene measures should be pursued with any patient. 4. For chronic sleep maintenance problems, as referral to a psychologist for consideration of cognitive behavioral therapy (CBT-I) can be considered.  5. The patient will be encouraged to follow-up with her referring provider, who will be notified of the test results.  I certify that I have reviewed the entire raw data recording prior to the issuance of this report in accordance with the Standards of Accreditation of the American Academy of Sleep Medicine (AASM)   Star Age, MD, PhD Diplomat, American Board of Psychiatry and Neurology (Neurology and Sleep Medicine)

## 2016-12-08 NOTE — Telephone Encounter (Signed)
I called pt to discuss her sleep study results. No answer, left a message asking her to call me back. 

## 2016-12-08 NOTE — Telephone Encounter (Signed)
Pt returned RN's call °

## 2016-12-08 NOTE — Telephone Encounter (Signed)
-----   Message from Star Age, MD sent at 12/08/2016  8:28 AM EDT ----- Patient referred by Dr. Philip Aspen, seen by me on 11/25/16, diagnostic PSG on 12/06/16.   Please call and notify the patient that the recent sleep study did not show any significant obstructive sleep apnea with the exception of mild, intermittent snoring. No significant/intrinsic or organic sleep d/o, which is reassuring, slept about 84% of the time tested, which is fairly good.  For disturbing snoring, an oral appliance (through a qualified dentist) can be considered.  For chronic sleep maintenance problems, as referral to a psychologist for consideration of cognitive behavioral therapy (CBT-I) can be considered - she may want to discuss with PCP. Please remind patient to try to maintain good sleep hygiene, which means: Keep a regular sleep and wake schedule and make enough time for sleep (7 1/2 to 8 1/2 hours for the average adult), try not to exercise or have a meal within 2 hours of your bedtime, try to keep your bedroom conducive for sleep, that is, cool and dark, without light distractors such as an illuminated alarm clock, and refrain from watching TV right before sleep or in the middle of the night and do not keep the TV or radio on during the night. If a nightlight is used, have it away from the visual field. Also, try not to use or play on electronic devices at bedtime, such as your cell phone, tablet PC or laptop. If you like to read at bedtime on an electronic device, try to dim the background light as much as possible. Do not eat in the middle of the night. Keep pets away from the bedroom environment. For stress relief, try meditation, deep breathing exercises (there are many books and CDs available), a white noise machine or fan can help to diffuse other noise distractors, such as traffic noise. Do not drink alcohol before bedtime, as it can disturb sleep and cause middle of the night awakenings. Never mix alcohol and sedating  medications! Avoid narcotic pain medication close to bedtime, as opioids/narcotics can suppress breathing drive and breathing effort.   She can follow up with Dr. Philip Aspen.   Once you have spoken to patient, you can close this encounter.   Thanks,  Star Age, MD, PhD [facility])

## 2016-12-08 NOTE — Telephone Encounter (Signed)
I called pt. I advised pt that Dr. Rexene Alberts reviewed pt's sleep study and found that pt did not show any significant obstructive sleep apnea, with the exception of mild, intermittent snoring. No significant/intrinsic sleep disorder was found, which is reassuring. Pt slept about 84% of the time tested, which is fairly good. Dr. Rexene Alberts recommends that pt consider an oral appliance made by a qualified dentist, if her snoring is disturbing. I reviewed sleep hygiene recommendations with the pt, including trying to keep a regular sleep wake schedule, avoiding electronics in the bedroom, keeping the bedroom cool, dark, and quiet, and avoiding eating or exercising within 2 hours of bedtime as well as eating in the middle of the night. I advised pt to keep pets away from the bedtime. I discussed with pt the importance of stress relief and to try meditation, deep breathing exercises, and/or a white noise machine or fan to diffuse other noise distracters. I advised pt to not drink alcohol before bedtime and to never mix alcohol and sedating medications. Pt was advised to avoid narcotic pain medication close to bedtime. I advised pt that a copy of these sleep study results will be sent to Dr. Philip Aspen. Pt verbalized understanding of results. Pt had no questions at this time but was encouraged to call back if questions arise.

## 2017-05-01 IMAGING — US US THYROID
1 series · 13 of 25 positions shown · non-contrast
Comparison: 08/14/2013

CLINICAL DATA: Bilateral thyroid nodules

EXAM:
THYROID ULTRASOUND
TECHNIQUE: Ultrasound examination of the thyroid gland and adjacent soft
tissues was performed.

[Series 1: us thyroid · 0.07mm/px · 13 of 43 slices shown]
[im 1/43]
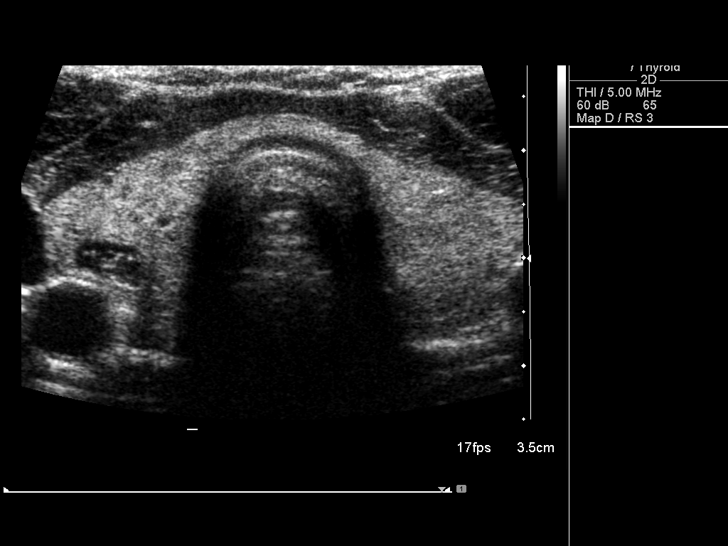
[im 4/43]
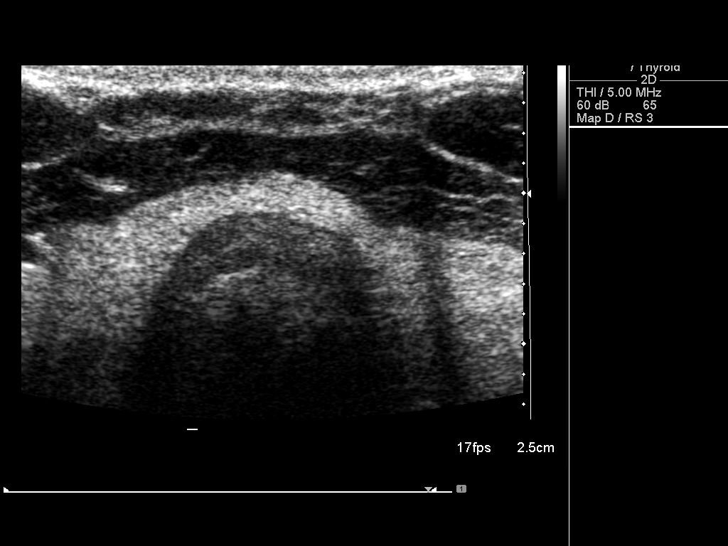
[im 8/43]
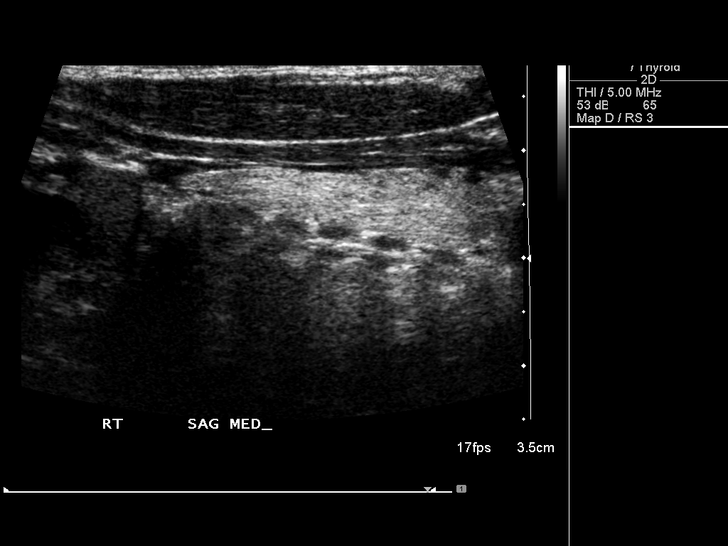
[im 11/43]
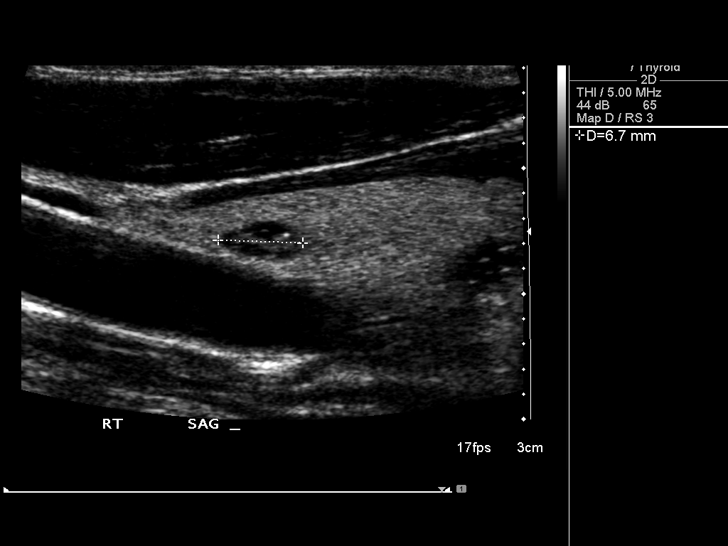
[im 15/43]
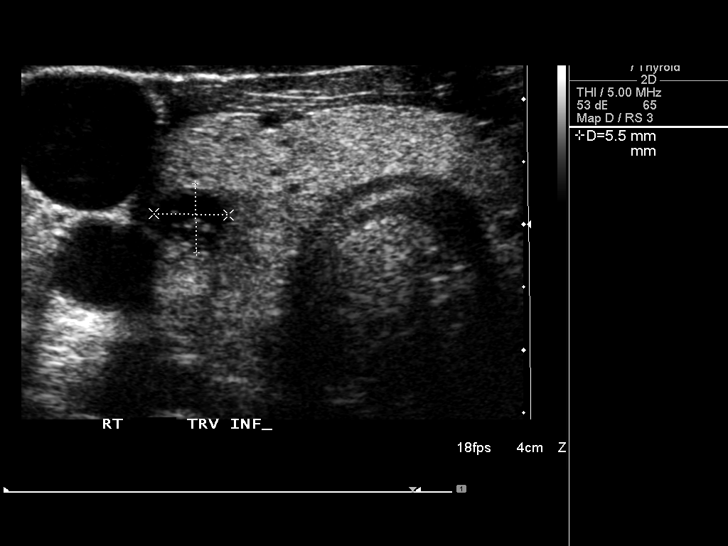
[im 18/43]
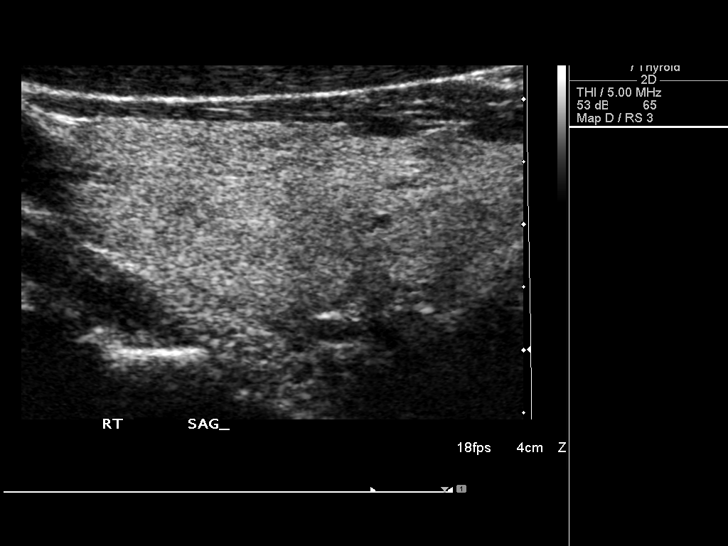
[im 22/43]
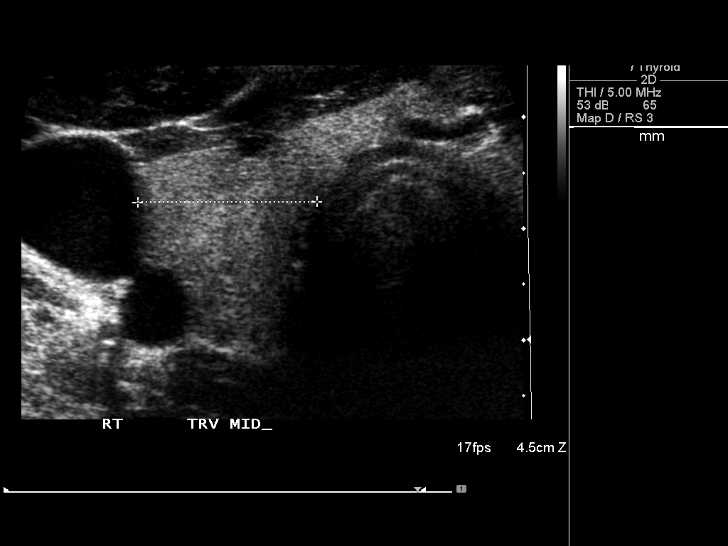
[im 25/43]
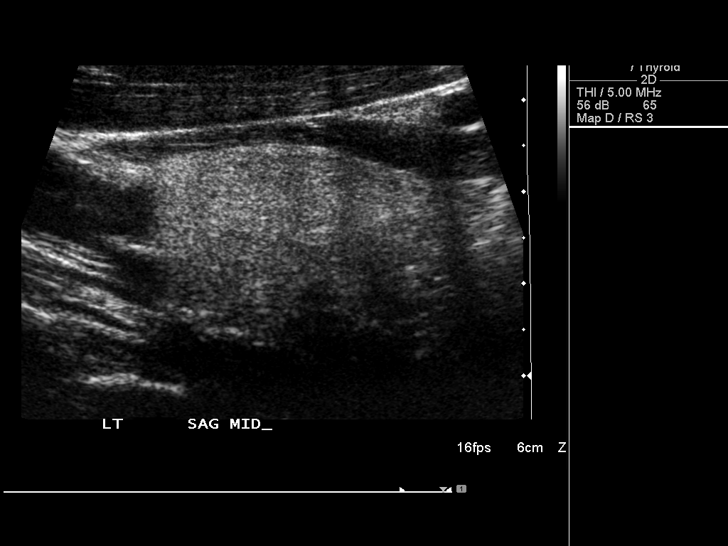
[im 29/43]
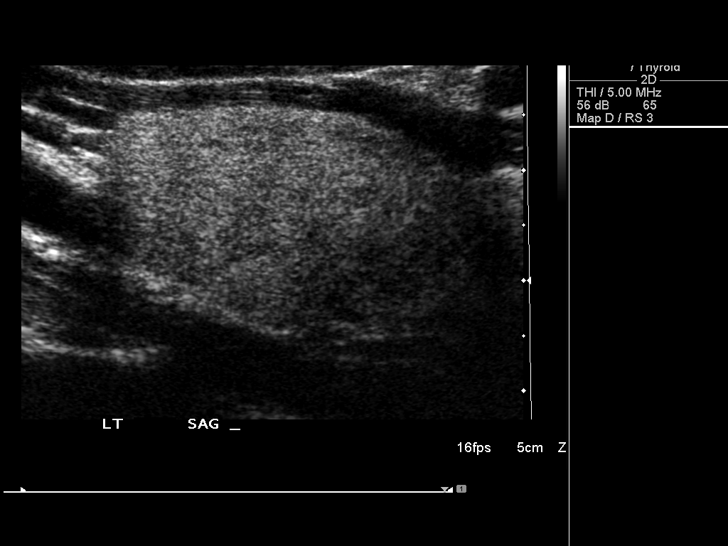
[im 32/43]
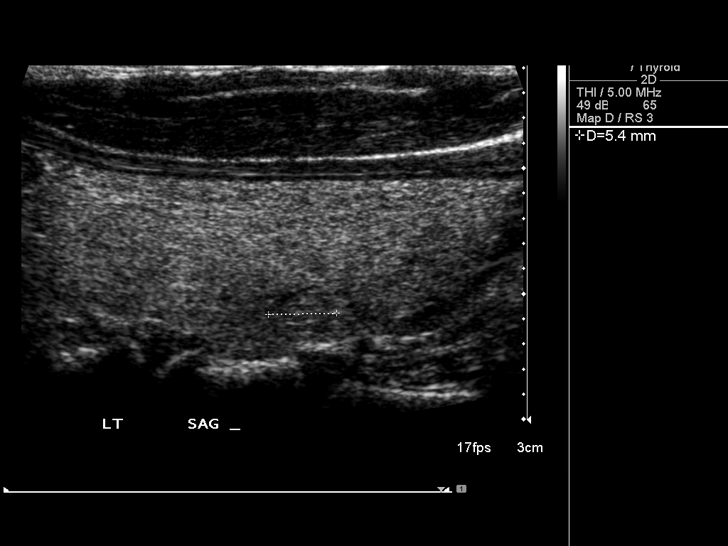
[im 36/43]
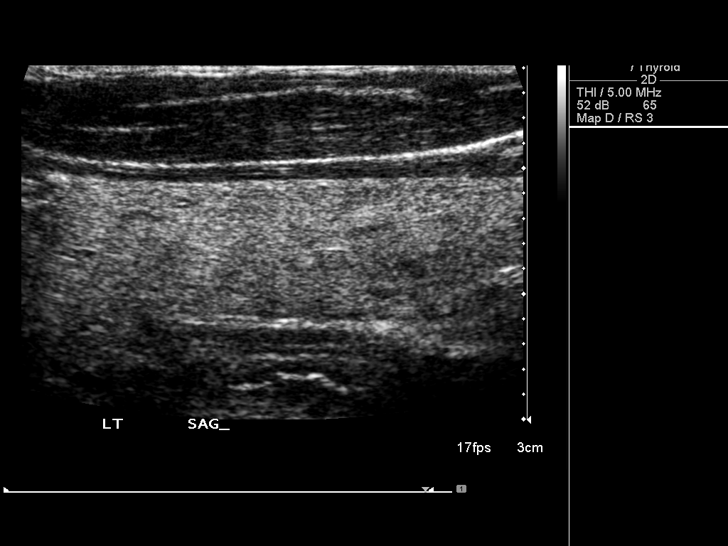
[im 39/43]
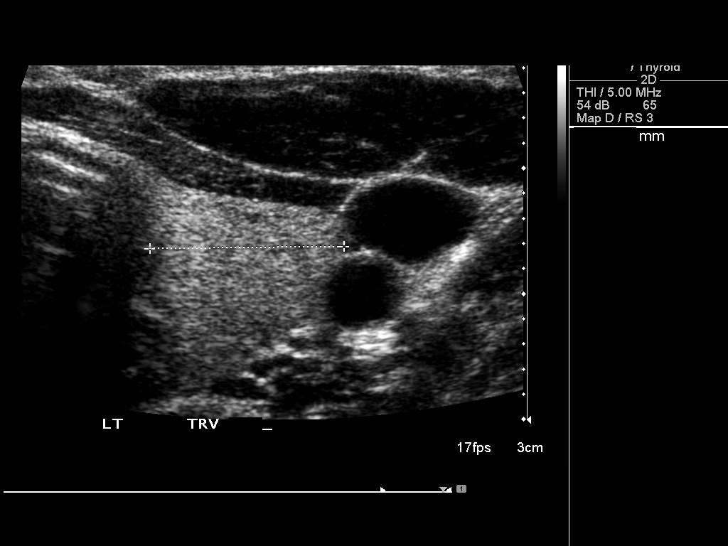
[im 43/43]
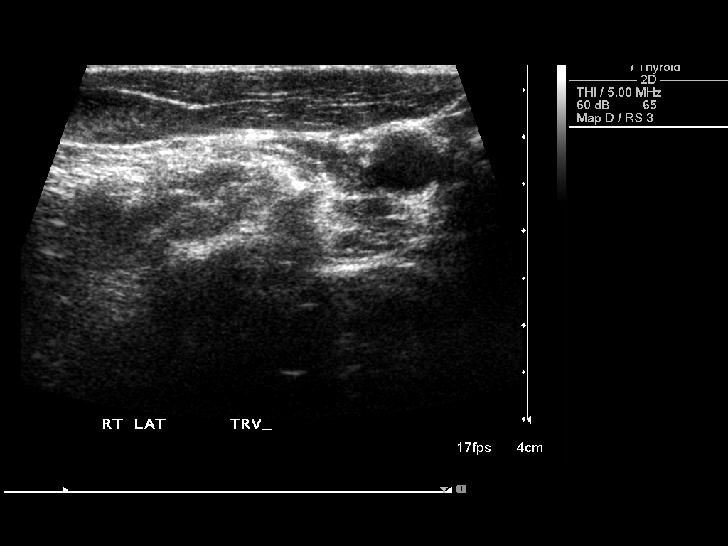

[13 of 25 positions shown; findings below may reference images not displayed]

FINDINGS: Parenchymal Echotexture: Mildly heterogenous

Estimated total number of nodules >/= 1 cm: 0

Number of spongiform nodules >/=  2 cm not described below (TR1): 0

Number of mixed cystic and solid nodules >/= 1.5 cm not described
below (TR2): 0

_________________________________________________________

Isthmus: 3 mm

No discrete nodules are identified within the thyroid isthmus.

_________________________________________________________

Right lobe: 4.6 x 1.6 x 1.6 cm, previously 4.3 x 1.6 x 1.8 cm

Two small spongiform cystic nodules noted in the upper and mid poles
as before. No significant interval change. These measure 8 mm or
less in size.

_________________________________________________________

Left lobe: 3.8 x 2.1 x 1.6 cm, previously 4.0 x 1.4 x

Incidental left inferior pole isoechoic solid nodule measures only 7
mm. No other significant left thyroid abnormality.
IMPRESSION: Small subcentimeter incidental thyroid nodules bilaterally, all
measuring 8 mm or less in size. These nodules do not meet criteria
for biopsy or any additional follow-up.

The above is in keeping with the ACR TI-RADS recommendations - [HOSPITAL] 2370;[DATE].

## 2017-08-03 ENCOUNTER — Encounter (HOSPITAL_BASED_OUTPATIENT_CLINIC_OR_DEPARTMENT_OTHER): Payer: Self-pay | Admitting: *Deleted

## 2017-08-03 ENCOUNTER — Other Ambulatory Visit: Payer: Self-pay

## 2017-08-03 ENCOUNTER — Emergency Department (HOSPITAL_BASED_OUTPATIENT_CLINIC_OR_DEPARTMENT_OTHER)
Admission: EM | Admit: 2017-08-03 | Discharge: 2017-08-03 | Disposition: A | Discharge status: Home / Self Care | Payer: BC Managed Care – PPO | Attending: Emergency Medicine | Admitting: Emergency Medicine

## 2017-08-03 ENCOUNTER — Emergency Department (HOSPITAL_BASED_OUTPATIENT_CLINIC_OR_DEPARTMENT_OTHER): Payer: BC Managed Care – PPO

## 2017-08-03 DIAGNOSIS — M79601 Pain in right arm: Secondary | ICD-10-CM | POA: Diagnosis not present

## 2017-08-03 DIAGNOSIS — R52 Pain, unspecified: Secondary | ICD-10-CM

## 2017-08-03 DIAGNOSIS — Z79899 Other long term (current) drug therapy: Secondary | ICD-10-CM | POA: Insufficient documentation

## 2017-08-03 DIAGNOSIS — R030 Elevated blood-pressure reading, without diagnosis of hypertension: Secondary | ICD-10-CM

## 2017-08-03 DIAGNOSIS — I10 Essential (primary) hypertension: Secondary | ICD-10-CM | POA: Diagnosis not present

## 2017-08-03 LAB — CBC WITH DIFFERENTIAL/PLATELET
Basophils Absolute: 0 10*3/uL (ref 0.0–0.1)
Basophils Relative: 0 %
EOS ABS: 0.1 10*3/uL (ref 0.0–0.7)
EOS PCT: 2 %
HCT: 35.4 % — ABNORMAL LOW (ref 36.0–46.0)
HEMOGLOBIN: 12.6 g/dL (ref 12.0–15.0)
LYMPHS ABS: 1.6 10*3/uL (ref 0.7–4.0)
Lymphocytes Relative: 37 %
MCH: 31.8 pg (ref 26.0–34.0)
MCHC: 35.6 g/dL (ref 30.0–36.0)
MCV: 89.4 fL (ref 78.0–100.0)
Monocytes Absolute: 0.6 10*3/uL (ref 0.1–1.0)
Monocytes Relative: 13 %
NEUTROS PCT: 48 %
Neutro Abs: 2.1 10*3/uL (ref 1.7–7.7)
PLATELETS: 249 10*3/uL (ref 150–400)
RBC: 3.96 MIL/uL (ref 3.87–5.11)
RDW: 12 % (ref 11.5–15.5)
WBC: 4.4 10*3/uL (ref 4.0–10.5)

## 2017-08-03 LAB — BASIC METABOLIC PANEL
Anion gap: 8 (ref 5–15)
BUN: 15 mg/dL (ref 6–20)
CO2: 29 mmol/L (ref 22–32)
Calcium: 9.6 mg/dL (ref 8.9–10.3)
Chloride: 105 mmol/L (ref 101–111)
Creatinine, Ser: 0.83 mg/dL (ref 0.44–1.00)
GFR calc Af Amer: >60 mL/min (ref >60)
GFR calc non Af Amer: >60 mL/min (ref >60)
Glucose, Bld: 124 mg/dL — ABNORMAL HIGH (ref 65–99)
Potassium: 4 mmol/L (ref 3.5–5.1)
Sodium: 142 mmol/L (ref 135–145)

## 2017-08-03 LAB — TROPONIN I: Troponin I: <0.03 ng/mL (ref <0.03)

## 2017-08-03 NOTE — ED Triage Notes (Signed)
Sharp right arm pain since yesterday that comes and goes. She does not have limited ROM.

## 2017-08-03 NOTE — ED Provider Notes (Signed)
[facility] Provider Note   CSN: [id number] Arrival date & time: 08/03/17  1708     History   Chief Complaint Chief Complaint  Patient presents with  . Arm Pain    HPI Kathy Garrett is a 47 y.o. female personal history of anemia, hypertension, hyperlipidemia who presents for evaluation of intermittent right upper extremity pain that has been ongoing since last night.  Patient reports that she will intermittently have a sharp pain in her right upper arm.  She denies any preceding trauma, injury, fall.  She states that she is not currently having the pain.  She states that she has not taking medications for the pain.  She has been able to move the arm and has full use of arm without any difficulty.  Patient denies any warmth, redness, swelling.  Patient denies any numbness, weakness.  Patient also concerned about her blood pressure.  She states that she checked it last night and was 146/94.  Patient states that she had recently been switched on blood pressure medications and was supposed to have added lisinopril to her medications which she states that she just received the other day.  Patient denies any fevers, chest pain, difficulty breathing, numbness/weakness, abdominal pain, nausea/vomiting, neck pain, back pain.  Patient reports that her grandmother had a stroke at 27 and that her mom had a heart attack at 44.  She denies any personal cardiac history.  Patient denies any smoking.  The history is provided by the patient.    Past Medical History:  Diagnosis Date  . Anemia    years ago  . Bronchitis 06/2016  . Glaucoma   . Hyperlipidemia   . Hypertension   . Peripheral vascular disease (HCC)    right leg  . PONV (postoperative nausea and vomiting)   . Sleep apnea    home study done needs overnight study    Patient Active Problem List   Diagnosis Date Noted  . S/P TAH (total abdominal hysterectomy) 10/26/2016    Past Surgical History:    Procedure Laterality Date  . ABDOMINAL HYSTERECTOMY    . CESAREAN SECTION  04/06/2010  . DIAGNOSTIC LAPAROSCOPY     08/2008  . HYSTERECTOMY ABDOMINAL WITH SALPINGECTOMY Bilateral 10/26/2016   Procedure: HYSTERECTOMY ABDOMINAL WITH SALPINGECTOMY;  Surgeon: Louretta Shorten, MD;  Location: [facility];  Service: Gynecology;  Laterality: Bilateral;  . MYOMECTOMY  09/1999  . MYOMECTOMY  03/2007  . WISDOM TOOTH EXTRACTION       OB History   None      Home Medications    Prior to Admission medications   Medication Sig Start Date End Date Taking? Authorizing Provider  ALPRAZolam Duanne Moron) 0.25 MG tablet Take 1 tablet by mouth at bedtime as needed for anxiety. 10/08/16   [provider]  Calcium-Vitamin D-Vitamin K (VIACTIV PO) Take by mouth.    [provider]  metoprolol succinate (TOPROL-XL) 50 MG 24 hr tablet Take 25 mg by mouth 2 (two) times daily. Take with or immediately following a meal.    [provider]  valsartan (DIOVAN) 160 MG tablet Take 160 mg by mouth daily.    [provider]    Family History No family history on file.  Social History Social History   Tobacco Use  . Smoking status: Never Smoker  . Smokeless tobacco: Never Used  Substance Use Topics  . Alcohol use: No  . Drug use: No     Allergies   Patient  has no known allergies.   Review of Systems Review of Systems  Constitutional: Negative for chills and fever.  HENT: Negative for congestion.   Eyes: Negative for visual disturbance.  Respiratory: Negative for cough and shortness of breath.   Cardiovascular: Negative for chest pain.  Gastrointestinal: Negative for abdominal pain, diarrhea, nausea and vomiting.  Genitourinary: Negative for dysuria and hematuria.  Musculoskeletal: Negative for back pain and neck pain.       Right upper extremity pain  Skin: Negative for rash.  Neurological: Negative for weakness and numbness.  Psychiatric/Behavioral: Negative for confusion.   All other systems reviewed and are negative.    Physical Exam Updated Vital Signs BP (!) 148/94   Pulse 75   Temp 98.5 F (36.9 C) (Oral)   Resp 16   Ht 5\' 2"  (1.575 m)   Wt 65.8 kg (145 lb)   SpO2 100%   BMI 26.52 kg/m   Physical Exam  Constitutional: She is oriented to person, place, and time. She appears well-developed and well-nourished.  Sitting comfortably on examination table  HENT:  Head: Normocephalic and atraumatic.  Mouth/Throat: Oropharynx is clear and moist and mucous membranes are normal.  Eyes: Pupils are equal, round, and reactive to light. Conjunctivae, EOM and lids are normal.  Neck: Full passive range of motion without pain.  Cardiovascular: Normal rate, regular rhythm, normal heart sounds and normal pulses. Exam reveals no gallop and no friction rub.  No murmur heard. Pulses:      Radial pulses are 2+ on the right side, and 2+ on the left side.  Pulmonary/Chest: Effort normal and breath sounds normal.  No evidence of respiratory distress. Able to speak in full sentences without difficulty.  Abdominal: Soft. Normal appearance. There is no tenderness. There is no rigidity and no guarding.  Musculoskeletal: Normal range of motion.  No tenderness palpation noted to the right shoulder, humerus, elbow, forearm, wrist.  No deformity or crepitus noted.  Full range of motion of right upper extremity without any difficulty. Compartments are soft.   Neurological: She is alert and oriented to person, place, and time.  Follows commands, Moves all extremities  5/5 strength to BUE and BLE  Sensation intact throughout all major nerve distributions  Skin: Skin is warm and dry. Capillary refill takes less than 2 seconds.  Good distal cap refill.  RUE is not dusky in appearance or cool to touch.  Psychiatric: She has a normal mood and affect. Her speech is normal.  Nursing note and vitals reviewed.    ED Treatments / Results  Labs (all labs ordered are listed, but  only abnormal results are displayed) Labs Reviewed  BASIC METABOLIC PANEL - Abnormal; Notable for the following components:      Result Value   Glucose, Bld 124 (*)    All other components within normal limits  CBC WITH DIFFERENTIAL/PLATELET - Abnormal; Notable for the following components:   HCT 35.4 (*)    All other components within normal limits  TROPONIN I    EKG EKG Interpretation  Date/Time:  Tuesday August 03 2017 17:26:22 EDT Ventricular Rate:  66 PR Interval:  120 QRS Duration: 82 QT Interval:  404 QTC Calculation: 423 R Axis:   48 Text Interpretation:  Normal sinus rhythm Septal infarct , age undetermined Abnormal ECG No change vs comparison Confirmed by Tanna Furry [id number]) on 08/03/2017 5:32:44 PM   Radiology Dg Humerus Right  Result Date: 08/03/2017 CLINICAL DATA:  Pain, no injury EXAM: RIGHT HUMERUS -  2+ VIEW COMPARISON:  None. FINDINGS: There is no evidence of fracture or other focal bone lesions. Soft tissues are unremarkable. IMPRESSION: Negative. Electronically Signed   By: Franchot Gallo M.D.   On: 08/03/2017 18:17    Procedures Procedures (including critical care time)  Medications Ordered in ED Medications - No data to display   Initial Impression / Assessment and Plan / ED Course  I have reviewed the triage vital signs and the nursing notes.  Pertinent labs & imaging results that were available during my care of the patient were reviewed by me and considered in my medical decision making (see chart for details).     47 year old female who presents for evaluation of right upper extremity pain that began last night.  Reports is an intermittent sharp.  No preceding trauma, injury, fall.  No numbness/weakness.  Patient also concerned about her blood pressure and states that it was elevated last night at 146/94.  Recently added lisinopril to her medications which she states she has not picked up yet. Patient is afebrile, non-toxic appearing, sitting  comfortably on examination table. Vital signs reviewed and stable. Patient is neurovascularly intact.  Consider electrolyte imbalance versus musculoskeletal pain.  Low suspicion for fracture dislocation given history/physical exam.  Low suspicion for ACS etiology given history/physical exam but patient concerned given her family history.  I suspect that patient's symptoms may be related to musculoskeletal pain as where she points when she has the pain is more in the muscle distribution of the arm rather than any bony abnormality. History/physical exam is not concerning for hypertensive emergency, acute arterial embolism, aortic dissection, DVT of the upper extremity, CVA.  Plan to check basic labs, EKG. Plan to obtain XR imaging for further evaluation.   CBC without any significant leukocytosis, anemia.  BMP is unremarkable.  Troponin is negative.  X-ray of humerus is negative for any acute abnormality.  Discussed results with patient.  Patient reports she is not actively having any pain at this time.  Patient currently denying complaints at this time.  Patient's vital signs are stable.  She is still slightly hypertensive but she has been instructed to start 2 medications and has not taken them which likely explains her hypertension.  Exam is not concerning for hypertensive emergency.  Instructed patient take her blood pressure medications as directed.  Instructed her to follow-up with her primary care doctor the next 24-48 hours for further evaluation. Patient had ample opportunity for questions and discussion. All patient's questions were answered with full understanding. Strict return precautions discussed. Patient expresses understanding and agreement to plan.   Final Clinical Impressions(s) / ED Diagnoses   Final diagnoses:  Right arm pain  Elevated blood pressure reading    ED Discharge Orders    None       Desma Mcgregor 08/03/17 1925    Tanna Furry, MD 08/15/17 2340

## 2017-08-03 NOTE — Discharge Instructions (Addendum)
You can take Tylenol or Ibuprofen as directed for pain. You can alternate Tylenol and Ibuprofen every 4 hours. If you take Tylenol at 1pm, then you can take Ibuprofen at 5pm. Then you can take Tylenol again at 9pm.   Follow-up with your primary care doctor in the next 2-4 days for further evaluation.  Take your blood pressure medications as directed.  Return to the emergency department for any fever, redness or swelling of the arm, worsening pain, numbness/weakness of the arm, chest pain, difficulty breathing, vision changes or any other worsening or concerning symptoms.

## 2017-10-25 ENCOUNTER — Other Ambulatory Visit: Payer: Self-pay | Admitting: Orthopedic Surgery

## 2017-10-27 ENCOUNTER — Encounter (HOSPITAL_BASED_OUTPATIENT_CLINIC_OR_DEPARTMENT_OTHER): Payer: Self-pay | Admitting: *Deleted

## 2017-10-27 ENCOUNTER — Other Ambulatory Visit: Payer: Self-pay

## 2017-11-02 ENCOUNTER — Other Ambulatory Visit: Payer: Self-pay

## 2017-11-02 ENCOUNTER — Ambulatory Visit (HOSPITAL_BASED_OUTPATIENT_CLINIC_OR_DEPARTMENT_OTHER): Payer: BC Managed Care – PPO | Admitting: Anesthesiology

## 2017-11-02 ENCOUNTER — Ambulatory Visit (HOSPITAL_BASED_OUTPATIENT_CLINIC_OR_DEPARTMENT_OTHER)
Admission: RE | Admit: 2017-11-02 | Discharge: 2017-11-02 | Disposition: A | Discharge status: Home / Self Care | Payer: BC Managed Care – PPO | Source: Ambulatory Visit | Attending: Orthopedic Surgery | Admitting: Orthopedic Surgery

## 2017-11-02 ENCOUNTER — Encounter (HOSPITAL_BASED_OUTPATIENT_CLINIC_OR_DEPARTMENT_OTHER)
Admission: RE | Disposition: A | Discharge status: Home / Self Care | Payer: Self-pay | Source: Ambulatory Visit | Attending: Orthopedic Surgery

## 2017-11-02 ENCOUNTER — Encounter (HOSPITAL_BASED_OUTPATIENT_CLINIC_OR_DEPARTMENT_OTHER): Payer: Self-pay | Admitting: *Deleted

## 2017-11-02 DIAGNOSIS — I739 Peripheral vascular disease, unspecified: Secondary | ICD-10-CM | POA: Insufficient documentation

## 2017-11-02 DIAGNOSIS — I10 Essential (primary) hypertension: Secondary | ICD-10-CM | POA: Diagnosis not present

## 2017-11-02 DIAGNOSIS — E785 Hyperlipidemia, unspecified: Secondary | ICD-10-CM | POA: Insufficient documentation

## 2017-11-02 DIAGNOSIS — M65312 Trigger thumb, left thumb: Secondary | ICD-10-CM | POA: Diagnosis not present

## 2017-11-02 DIAGNOSIS — D649 Anemia, unspecified: Secondary | ICD-10-CM | POA: Diagnosis not present

## 2017-11-02 DIAGNOSIS — Z79899 Other long term (current) drug therapy: Secondary | ICD-10-CM | POA: Diagnosis not present

## 2017-11-02 DIAGNOSIS — F419 Anxiety disorder, unspecified: Secondary | ICD-10-CM | POA: Diagnosis not present

## 2017-11-02 HISTORY — DX: Trigger thumb, left thumb: M65.312

## 2017-11-02 HISTORY — DX: Anxiety disorder, unspecified: F41.9

## 2017-11-02 HISTORY — PX: TRIGGER FINGER RELEASE: SHX641

## 2017-11-02 SURGERY — RELEASE, A1 PULLEY, FOR TRIGGER FINGER
Anesthesia: Regional | Site: Hand | Laterality: Left

## 2017-11-02 MED ORDER — SCOPOLAMINE 1 MG/3DAYS TD PT72: 1.0000 | MEDICATED_PATCH | Freq: Once | TRANSDERMAL | Status: DC | PRN

## 2017-11-02 MED ORDER — PROMETHAZINE HCL 25 MG/ML IJ SOLN: 6.2500 mg | INTRAMUSCULAR | Status: DC | PRN

## 2017-11-02 MED ORDER — FENTANYL CITRATE (PF) 100 MCG/2ML IJ SOLN: 25.0000 ug | INTRAMUSCULAR | Status: DC | PRN

## 2017-11-02 MED ORDER — OXYCODONE HCL 5 MG/5ML PO SOLN: 5.0000 mg | Freq: Once | ORAL | Status: DC | PRN

## 2017-11-02 MED ORDER — MIDAZOLAM HCL 2 MG/2ML IJ SOLN
1.0000 mg | INTRAMUSCULAR | Status: DC | PRN
  Administered 2017-11-02: 2 mg via INTRAVENOUS

## 2017-11-02 MED ORDER — CEFAZOLIN SODIUM-DEXTROSE 2-4 GM/100ML-% IV SOLN
2.0000 g | INTRAVENOUS | Status: AC
  Administered 2017-11-02: 2 g via INTRAVENOUS

## 2017-11-02 MED ORDER — OXYCODONE HCL 5 MG PO TABS: 5.0000 mg | ORAL_TABLET | Freq: Once | ORAL | Status: DC | PRN

## 2017-11-02 MED ORDER — MIDAZOLAM HCL 2 MG/2ML IJ SOLN
INTRAMUSCULAR | Status: AC
  Filled 2017-11-02: qty 2

## 2017-11-02 MED ORDER — TRAMADOL HCL 50 MG PO TABS: 50.0000 mg | ORAL_TABLET | Freq: Four times a day (QID) | ORAL | 0 refills | Status: DC | PRN

## 2017-11-02 MED ORDER — LIDOCAINE HCL (PF) 0.5 % IJ SOLN
INTRAMUSCULAR | Status: DC | PRN
  Administered 2017-11-02: 30 mL via INTRAVENOUS

## 2017-11-02 MED ORDER — PROPOFOL 500 MG/50ML IV EMUL
INTRAVENOUS | Status: DC | PRN
  Administered 2017-11-02: 50 ug/kg/min via INTRAVENOUS

## 2017-11-02 MED ORDER — FENTANYL CITRATE (PF) 100 MCG/2ML IJ SOLN
50.0000 ug | INTRAMUSCULAR | Status: DC | PRN
  Administered 2017-11-02: 100 ug via INTRAVENOUS

## 2017-11-02 MED ORDER — BUPIVACAINE HCL (PF) 0.25 % IJ SOLN
INTRAMUSCULAR | Status: DC | PRN
  Administered 2017-11-02: 4 mL

## 2017-11-02 MED ORDER — CEFAZOLIN SODIUM-DEXTROSE 2-4 GM/100ML-% IV SOLN
INTRAVENOUS | Status: AC
  Filled 2017-11-02: qty 100

## 2017-11-02 MED ORDER — ONDANSETRON HCL 4 MG/2ML IJ SOLN
INTRAMUSCULAR | Status: DC | PRN
  Administered 2017-11-02: 4 mg via INTRAVENOUS

## 2017-11-02 MED ORDER — CHLORHEXIDINE GLUCONATE 4 % EX LIQD: 60.0000 mL | Freq: Once | CUTANEOUS | Status: DC

## 2017-11-02 MED ORDER — LACTATED RINGERS IV SOLN
INTRAVENOUS | Status: DC
  Administered 2017-11-02: 12:00:00 via INTRAVENOUS

## 2017-11-02 MED ORDER — MEPERIDINE HCL 25 MG/ML IJ SOLN: 6.2500 mg | INTRAMUSCULAR | Status: DC | PRN

## 2017-11-02 MED ORDER — FENTANYL CITRATE (PF) 100 MCG/2ML IJ SOLN
INTRAMUSCULAR | Status: AC
  Filled 2017-11-02: qty 2

## 2017-11-02 SURGICAL SUPPLY — 35 items
BANDAGE COBAN STERILE 2 (GAUZE/BANDAGES/DRESSINGS) ×3 IMPLANT
BLADE SURG 15 STRL LF DISP TIS (BLADE) ×1 IMPLANT
BLADE SURG 15 STRL SS (BLADE) ×3
BNDG CMPR 9X4 STRL LF SNTH (GAUZE/BANDAGES/DRESSINGS)
BNDG ESMARK 4X9 LF (GAUZE/BANDAGES/DRESSINGS) IMPLANT
CHLORAPREP W/TINT 26ML (MISCELLANEOUS) ×3 IMPLANT
CORD BIPOLAR FORCEPS 12FT (ELECTRODE) IMPLANT
COVER BACK TABLE 60X90IN (DRAPES) ×3 IMPLANT
COVER MAYO STAND STRL (DRAPES) ×3 IMPLANT
CUFF TOURNIQUET SINGLE 18IN (TOURNIQUET CUFF) ×2 IMPLANT
DECANTER SPIKE VIAL GLASS SM (MISCELLANEOUS) IMPLANT
DRAPE EXTREMITY T 121X128X90 (DRAPE) ×3 IMPLANT
DRAPE SURG 17X23 STRL (DRAPES) ×3 IMPLANT
GAUZE SPONGE 4X4 12PLY STRL (GAUZE/BANDAGES/DRESSINGS) ×3 IMPLANT
GAUZE XEROFORM 1X8 LF (GAUZE/BANDAGES/DRESSINGS) ×3 IMPLANT
GLOVE BIO SURGEON STRL SZ 6.5 (GLOVE) ×1 IMPLANT
GLOVE BIO SURGEONS STRL SZ 6.5 (GLOVE) ×1
GLOVE BIOGEL PI IND STRL 6.5 (GLOVE) IMPLANT
GLOVE BIOGEL PI IND STRL 8.5 (GLOVE) ×1 IMPLANT
GLOVE BIOGEL PI INDICATOR 6.5 (GLOVE) ×2
GLOVE BIOGEL PI INDICATOR 8.5 (GLOVE) ×2
GLOVE SURG ORTHO 8.0 STRL STRW (GLOVE) ×3 IMPLANT
GOWN STRL REUS W/ TWL LRG LVL3 (GOWN DISPOSABLE) ×1 IMPLANT
GOWN STRL REUS W/TWL LRG LVL3 (GOWN DISPOSABLE) ×3
GOWN STRL REUS W/TWL XL LVL3 (GOWN DISPOSABLE) ×3 IMPLANT
NDL PRECISIONGLIDE 27X1.5 (NEEDLE) ×1 IMPLANT
NEEDLE PRECISIONGLIDE 27X1.5 (NEEDLE) ×3 IMPLANT
NS IRRIG 1000ML POUR BTL (IV SOLUTION) ×3 IMPLANT
PACK BASIN DAY SURGERY FS (CUSTOM PROCEDURE TRAY) ×3 IMPLANT
STOCKINETTE 4X48 STRL (DRAPES) ×3 IMPLANT
SUT ETHILON 4 0 PS 2 18 (SUTURE) ×3 IMPLANT
SYR BULB 3OZ (MISCELLANEOUS) ×3 IMPLANT
SYR CONTROL 10ML LL (SYRINGE) ×3 IMPLANT
TOWEL GREEN STERILE FF (TOWEL DISPOSABLE) ×6 IMPLANT
UNDERPAD 30X30 (UNDERPADS AND DIAPERS) ×3 IMPLANT

## 2017-11-02 NOTE — Brief Op Note (Signed)
11/02/2017  12:54 PM  PATIENT:  [name]  47 y.o. female  PRE-OPERATIVE DIAGNOSIS:  LEFT TRIGGER THUMB  POST-OPERATIVE DIAGNOSIS:  Left Trigger Thumb  PROCEDURE:  Procedure(s): RELEASE TRIGGER FINGER/A-1 PULLEY LEFT THUMB (Left)  SURGEON:  Surgeon(s) and Role:    Daryll Brod, MD - Primary  PHYSICIAN ASSISTANT:   ASSISTANTS: none   ANESTHESIA:   local, regional and general  EBL: 11ml BLOOD ADMINISTERED:none  DRAINS: none   LOCAL MEDICATIONS USED:  BUPIVICAINE   SPECIMEN:  No Specimen  DISPOSITION OF SPECIMEN:  N/A  COUNTS:  YES  TOURNIQUET:   Total Tourniquet Time Documented: Forearm (Left) - 16 minutes Total: Forearm (Left) - 16 minutes   DICTATION: .Viviann Spare Dictation  PLAN OF CARE: Discharge to home after PACU  PATIENT DISPOSITION:  PACU - hemodynamically stable.

## 2017-11-02 NOTE — H&P (Signed)
Kathy Garrett is an 47 y.o. female.   Chief Complaint:catching left thumbHPI: Kathy Garrett is a 47 year old right-hand-dominant female who comes in complaining of catching of her left thumb. This been going approximately 3 weeks. She recalls no history of injury. She complains of pain at the metacarpal phalangeal joint sharp in nature when it occurs 8/10 VAS score. She has tried taking ibuprofen without relief. She has on 2 occasions had to use her opposite hand to straighten it out. She says he has difficulty pinching and opening a jar or pack of catcher. She has no history of diabetes thyroid problems arthritis or gout. Family history is positive diabetes arthritis and gout negative for thyroid problems. She has been tested. She has no numbness or tingling. She is complaining of feeling of stiffness on her hands bilaterally. She has had this injected on 2 occasions.She has had this injected on 2 occasions.              Past Medical History:  Diagnosis Date  . Anemia    years ago  . Anxiety   . Bronchitis 06/2016  . Glaucoma   . Hyperlipidemia   . Hypertension   . Peripheral vascular disease (HCC)    right leg  . PONV (postoperative nausea and vomiting)   . Trigger thumb of left hand     Past Surgical History:  Procedure Laterality Date  . ABDOMINAL HYSTERECTOMY    . CESAREAN SECTION  04/06/2010  . DIAGNOSTIC LAPAROSCOPY     08/2008  . HYSTERECTOMY ABDOMINAL WITH SALPINGECTOMY Bilateral 10/26/2016   Procedure: HYSTERECTOMY ABDOMINAL WITH SALPINGECTOMY;  Surgeon: Louretta Shorten, MD;  Location: [facility];  Service: Gynecology;  Laterality: Bilateral;  . MYOMECTOMY  09/1999  . MYOMECTOMY  03/2007  . WISDOM TOOTH EXTRACTION      History reviewed. No pertinent family history. Social History:  reports that she has never smoked. She has never used smokeless tobacco. She reports that she does not drink alcohol or use drugs.  Allergies: No Known Allergies  No medications prior to admission.     No results found for this or any previous visit (from the past 48 hour(s)).  No results found.   Pertinent items are noted in HPI.  Height 5\' 2"  (1.575 m), weight 65.8 kg (145 lb).  General appearance: alert, cooperative and appears stated age Head: Normocephalic, without obvious abnormality Neck: no JVD Resp: clear to auscultation bilaterally Cardio: regular rate and rhythm, S1, S2 normal, no murmur, click, rub or gallop GI: soft, non-tender; bowel sounds normal; no masses,  no organomegaly Extremities: catching left thumb Pulses: 2+ and symmetric Skin: Skin color, texture, turgor normal. No rashes or lesions Neurologic: Grossly normal Incision/Wound: na  Assessment/Plan Assessment:  1. Trigger finger of left thumb    Plan: Advised to use Aleve 2 twice a day with meals and precautions are to see if this will settle symptoms on her right side. She would like to proceed to surgical release of the A1 pulley of the left thumb. Pre-peri-and postoperative course been discussed along with risk complications. She is aware there is no guarantee to the surgery the possibility of infection recurrence injury to arteries nerves tendons complete relief symptoms dystrophy he is scheduled for A1 pulley release left thumb as an outpatient under regional anesthesia.       Zakary Kimura R 11/02/2017, 10:10 AM

## 2017-11-02 NOTE — Transfer of Care (Signed)
Immediate Anesthesia Transfer of Care Note  Patient: Kathy Garrett  Procedure(s) Performed: RELEASE TRIGGER FINGER/A-1 PULLEY LEFT THUMB (Left Hand)  Patient Location: PACU  Anesthesia Type:MAC and Bier block  Level of Consciousness: awake, alert  and oriented  Airway & Oxygen Therapy: Patient Spontanous Breathing and Patient connected to face mask oxygen  Post-op Assessment: Report given to RN and Post -op Vital signs reviewed and stable  Post vital signs: Reviewed and stable  Last Vitals:  Vitals Value Taken Time  BP    Temp    Pulse    Resp    SpO2      Last Pain:  Vitals:   11/02/17 1139  PainSc: 0-No pain         Complications: No apparent anesthesia complications

## 2017-11-02 NOTE — Op Note (Signed)
Preop diagnosis: Stenosing tenosynovitis left thumb  Postoperative diagnosis: Same  Operation: Release A1 pulley left thumb  Surgeon: Daryll Brod  Anesthesia: IV sedation IV regional with local infiltration  Placed surgery: [facility] day surgery  History: The patient is a 47 year old female with a history of triggering of her left thumb.  This is not responded to conservative treatment.  She has elected to undergo surgical release the A1 pulley.  Pre-peri-and postoperative course been discussed along with risks and complications.  She is aware there is no guarantee to the surgery the possibility of infection recurrence injury to arteries nerves tendons complete relief symptoms dystrophy.  In the preoperative area the patient is seen extremity marked by both patient and surgeon antibiotic given  Procedure: Patient brought to the operating room where a forearm-based IV regional anesthetic was carried out without difficulty under the direction of the anesthesia department.  She was prepped using ChloraPrep in supine position with left arm free.  With 3-minute dry time and a timeout taken to confirm patient procedure was done.  A transverse incision was made over the A1 pulley of the left thumb.  This carried down through subcutaneous tissue.  Neurovascular structures were identified protected.  The A1 pulley was identified.  Was found to be markedly thickened.  This was released on its radial aspect taking care to protect the oblique pulley.  Since then after retractors were placed to protect neurovascular structures.  Tenosynovial tissue proximally was separated with blunt dissection.  The thumb placed through full passive range of motion.  No further trigger was noted.  The wound was irrigated.  The skin was closed with interrupted 4 nylon sutures.  Local infiltration quarter percent bupivacaine without epinephrine was given approximately 5 to 6 cc was used.  A sterile compressive dressing with finger  to thumb free was applied.  Deflation of the tourniquet all fingers immediately pink.  She was taken to the recovery room for observation in satisfactory condition.  She will be discharged home to return to [facility] in 1 week on Tylenol with ibuprofen and Ultram for breakthrough.

## 2017-11-02 NOTE — Anesthesia Preprocedure Evaluation (Addendum)
Anesthesia Evaluation  Patient identified by MRN, date of birth, ID band Patient awake    Reviewed: Allergy & Precautions, NPO status , Patient's Chart, lab work & pertinent test results, reviewed documented beta blocker date and time   History of Anesthesia Complications (+) PONV and history of anesthetic complications  Airway Mallampati: II  TM Distance: >3 FB Neck ROM: Full    Dental no notable dental hx.    Pulmonary neg pulmonary ROS,    Pulmonary exam normal breath sounds clear to auscultation       Cardiovascular hypertension, Pt. on medications and Pt. on home beta blockers + Peripheral Vascular Disease  Normal cardiovascular exam Rhythm:Regular Rate:Normal     Neuro/Psych negative neurological ROS  negative psych ROS   GI/Hepatic negative GI ROS, Neg liver ROS,   Endo/Other  negative endocrine ROS  Renal/GU negative Renal ROS     Musculoskeletal negative musculoskeletal ROS (+)   Abdominal   Peds  Hematology negative hematology ROS (+) anemia ,   Anesthesia Other Findings   Reproductive/Obstetrics negative OB ROS                            Anesthesia Physical  Anesthesia Plan  ASA: II  Anesthesia Plan: Bier Block and Bier Block-LIDOCAINE ONLY   Post-op Pain Management:    Induction: Intravenous  PONV Risk Score and Plan: 3 and Ondansetron, Dexamethasone, Midazolam, Propofol infusion and Treatment may vary due to age or medical condition  Airway Management Planned: Simple Face Mask  Additional Equipment:   Intra-op Plan:   Post-operative Plan:   Informed Consent: I have reviewed the patients History and Physical, chart, labs and discussed the procedure including the risks, benefits and alternatives for the proposed anesthesia with the patient or authorized representative who has indicated his/her understanding and acceptance.   Dental advisory given  Plan  Discussed with: CRNA  Anesthesia Plan Comments:         Anesthesia Quick Evaluation

## 2017-11-02 NOTE — Discharge Instructions (Addendum)

## 2017-11-02 NOTE — Anesthesia Postprocedure Evaluation (Signed)
Anesthesia Post Note  Patient: [name]  Procedure(s) Performed: RELEASE TRIGGER FINGER/A-1 PULLEY LEFT THUMB (Left Hand)     Patient location during evaluation: PACU Anesthesia Type: Bier Block Level of consciousness: awake and alert Pain management: pain level controlled Vital Signs Assessment: post-procedure vital signs reviewed and stable Respiratory status: spontaneous breathing, nonlabored ventilation and respiratory function stable Cardiovascular status: stable and blood pressure returned to baseline Anesthetic complications: no    Last Vitals:  Vitals:   11/02/17 1330 11/02/17 1355  BP: 111/82 108/78  Pulse: (!) 52 (!) 58  Resp: 13 18  Temp:  36.6 C  SpO2: 100% 99%    Last Pain:  Vitals:   11/02/17 1355  PainSc: 0-No pain                 [name]

## 2017-11-03 ENCOUNTER — Encounter (HOSPITAL_BASED_OUTPATIENT_CLINIC_OR_DEPARTMENT_OTHER): Payer: Self-pay | Admitting: Orthopedic Surgery

## 2018-08-04 IMAGING — DX DG HUMERUS 2V *R*
2 series · 2 of 2 positions shown · non-contrast
Comparison: None.

CLINICAL DATA: Pain, no injury

EXAM:
RIGHT HUMERUS - 2+ VIEW

[humerus ap]
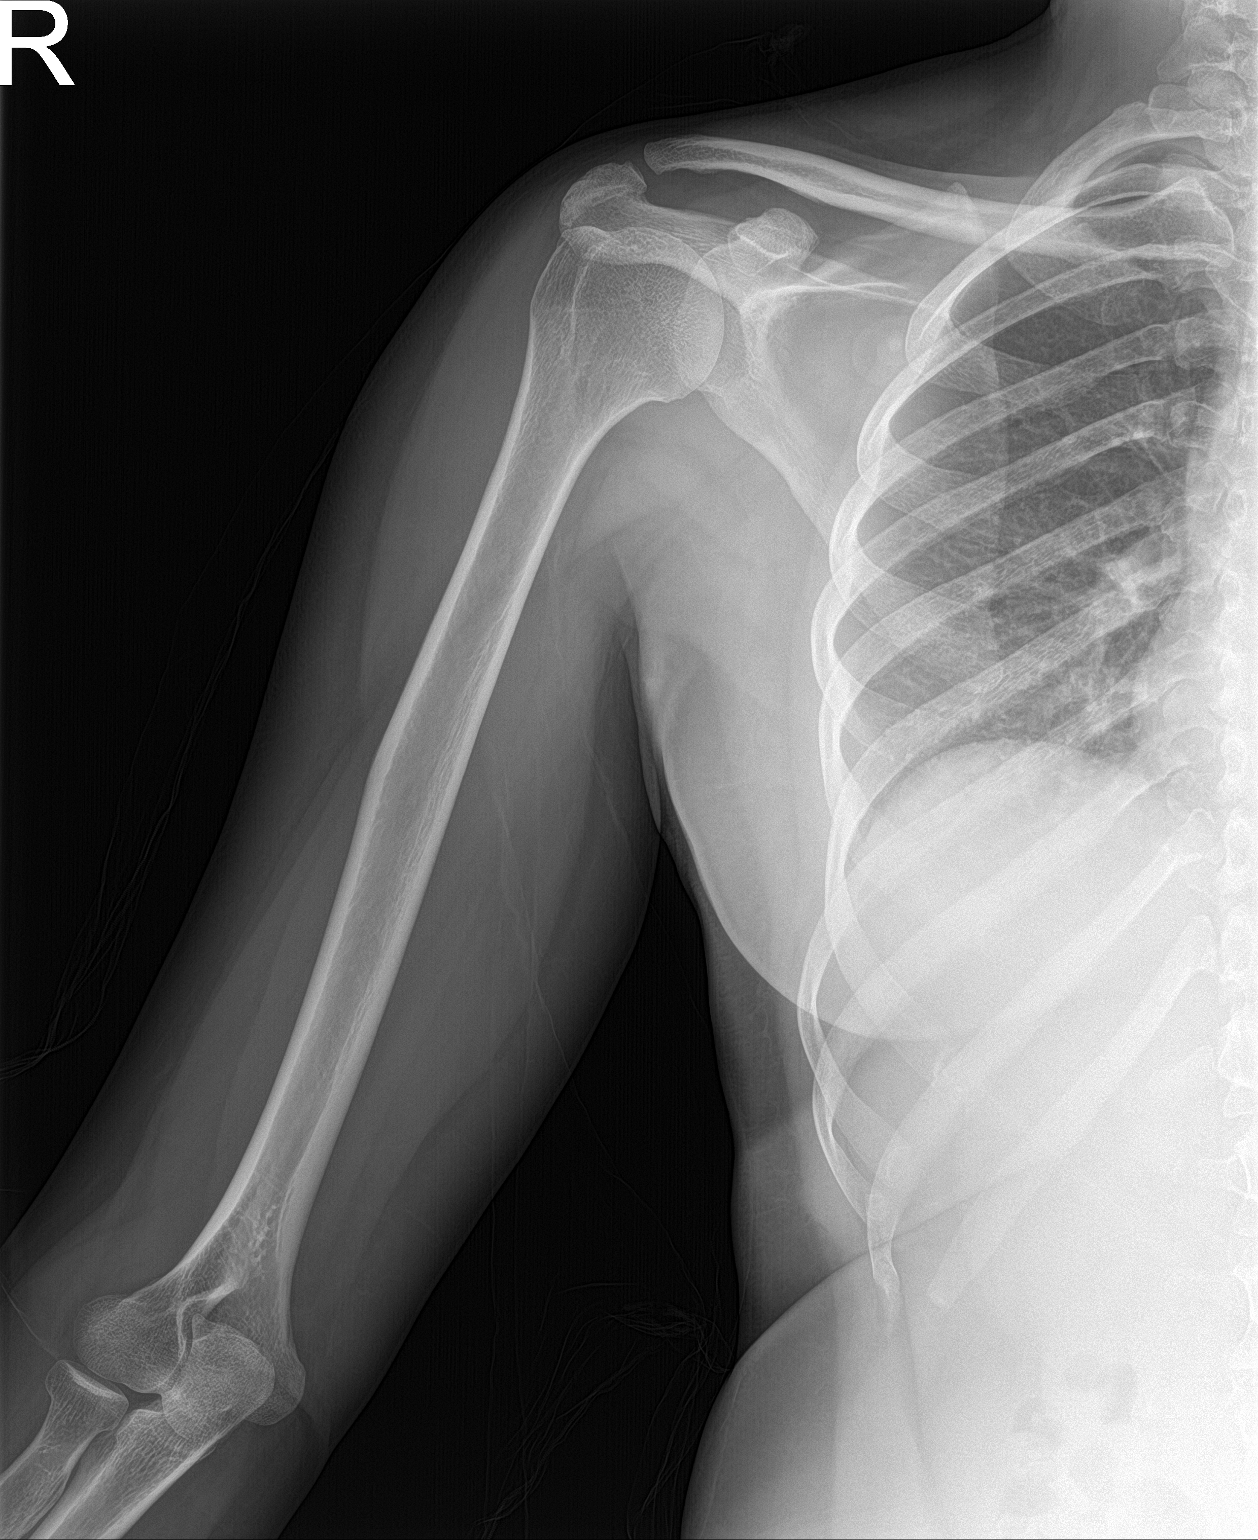

[humerus lat]
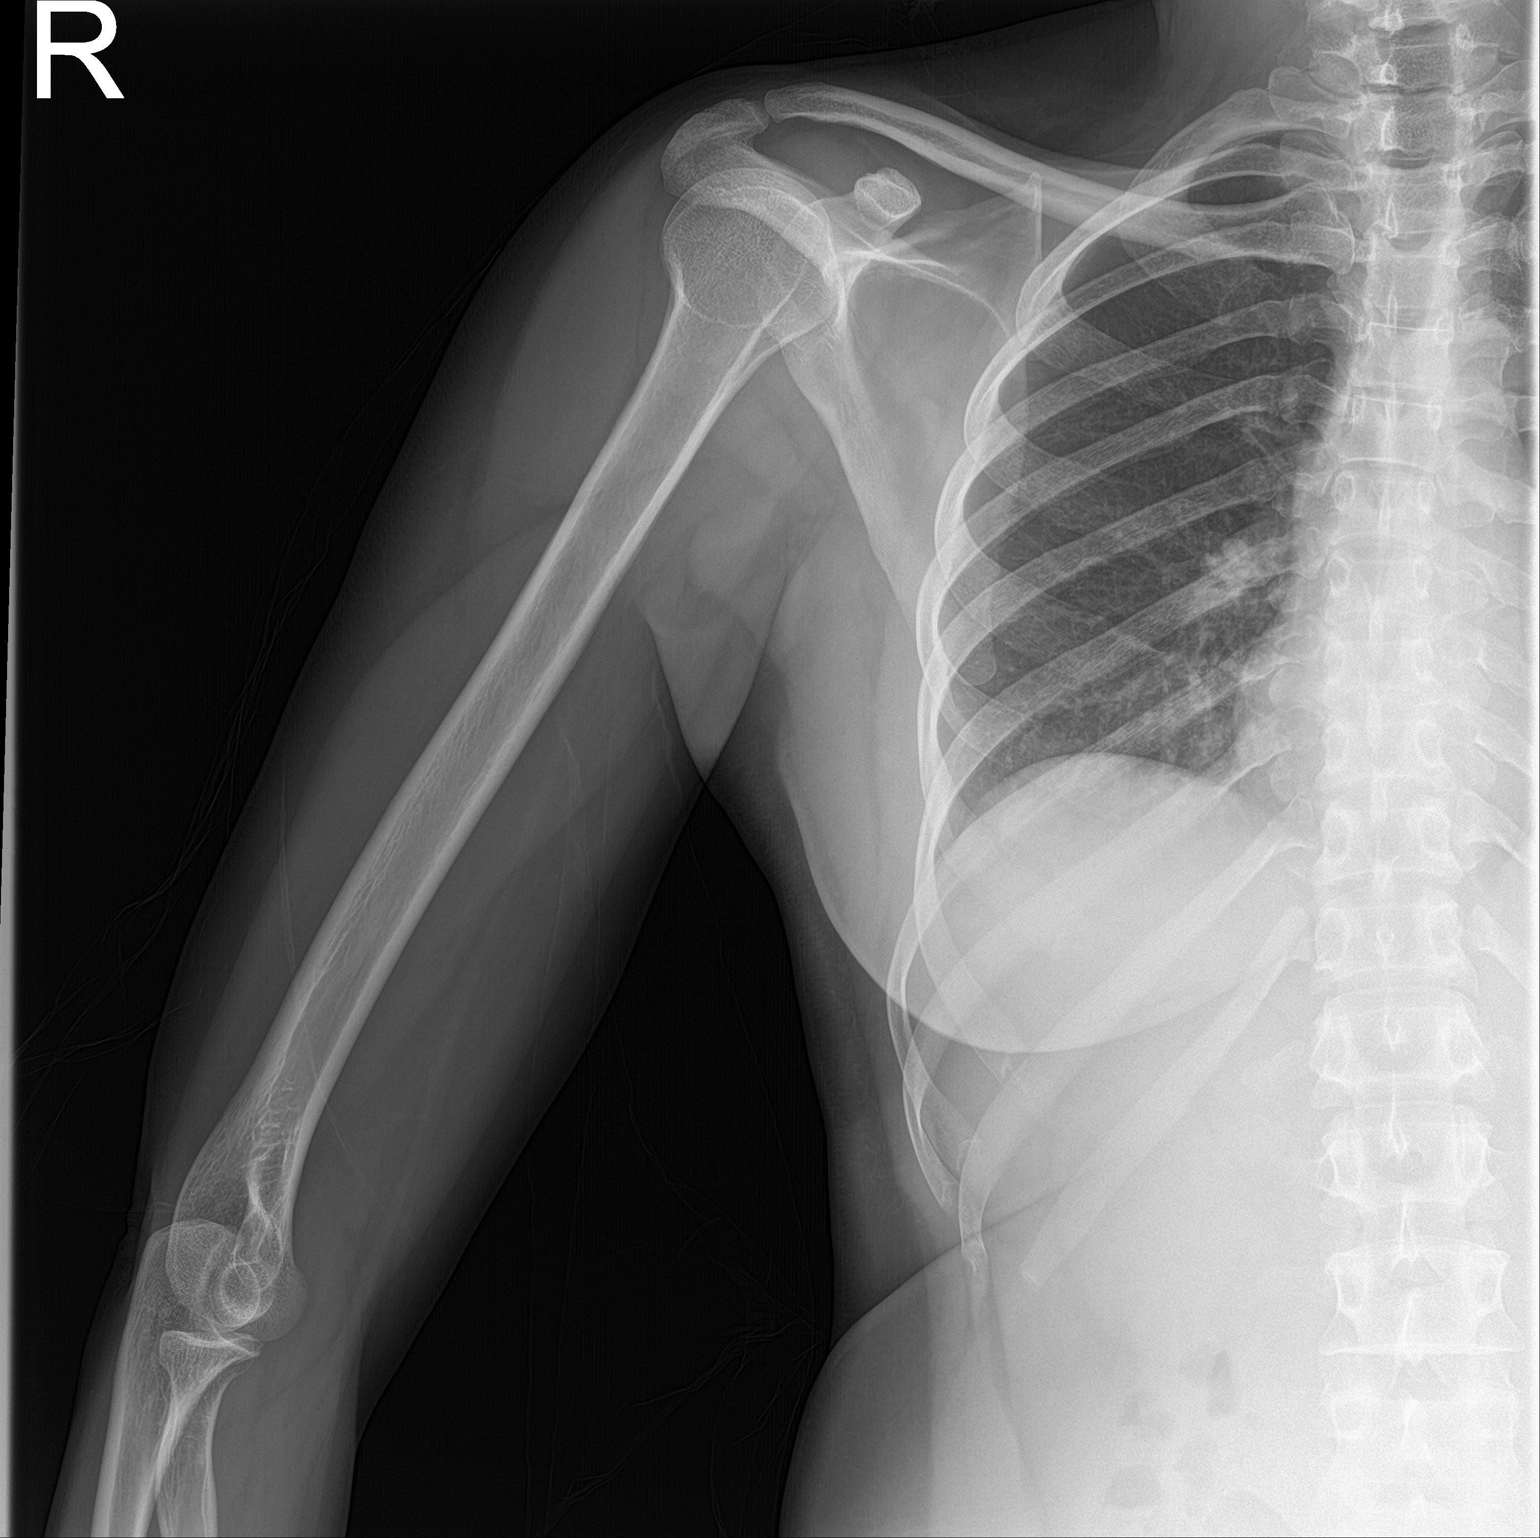

[2 of 2 positions shown; findings below may reference images not displayed]

FINDINGS: There is no evidence of fracture or other focal bone lesions. Soft
tissues are unremarkable.
IMPRESSION: Negative.

## 2019-03-20 ENCOUNTER — Other Ambulatory Visit: Payer: Self-pay

## 2019-03-20 DIAGNOSIS — Z20822 Contact with and (suspected) exposure to covid-19: Secondary | ICD-10-CM

## 2019-03-23 LAB — NOVEL CORONAVIRUS, NAA: SARS-CoV-2, NAA: NOT DETECTED

## 2020-03-09 ENCOUNTER — Ambulatory Visit: Payer: BC Managed Care – PPO | Attending: Internal Medicine

## 2020-03-09 DIAGNOSIS — Z23 Encounter for immunization: Secondary | ICD-10-CM

## 2020-03-09 NOTE — Progress Notes (Signed)
   Covid-19 Vaccination Clinic  Name:  GIZEL RIEDLINGER    MRN: [id number] DOB: 21-Oct-1970  03/09/2020  Ms. Bajaj was observed post Covid-19 immunization for 15 minutes without incident. She was provided with Vaccine Information Sheet and instruction to access the V-Safe system.   Ms. Miracle was instructed to call 911 with any severe reactions post vaccine: Marland Kitchen Difficulty breathing  . Swelling of face and throat  . A fast heartbeat  . A bad rash all over body  . Dizziness and weakness

## 2020-05-30 ENCOUNTER — Encounter: Payer: Self-pay | Admitting: Gastroenterology

## 2020-07-26 ENCOUNTER — Other Ambulatory Visit: Payer: Self-pay

## 2020-07-26 ENCOUNTER — Ambulatory Visit (AMBULATORY_SURGERY_CENTER): Payer: Self-pay | Admitting: *Deleted

## 2020-07-26 VITALS — Ht 63.0 in | Wt 154.6 lb

## 2020-07-26 DIAGNOSIS — Z1211 Encounter for screening for malignant neoplasm of colon: Secondary | ICD-10-CM

## 2020-07-26 MED ORDER — PEG 3350-KCL-NA BICARB-NACL 420 G PO SOLR: 4000.0000 mL | Freq: Once | ORAL | 0 refills | Status: AC

## 2020-07-26 NOTE — Progress Notes (Signed)
No egg or soy allergy known to patient   issues with past sedation with any surgeries or procedures of PONV  Patient denies ever being told they had issues or difficulty with intubation  No FH of Malignant Hyperthermia No diet pills per patient No home 02 use per patient  No blood thinners per patient  Pt denies issues with constipation  No A fib or A flutter  EMMI video to pt or via MyChart  COVID 19 guidelines implemented in PV today with Pt and RN  Pt is fully vaccinated  for Covid   Due to the COVID-19 pandemic we are asking patients to follow certain guidelines.  Pt aware of COVID protocols and [facility] guidelines   

## 2020-07-29 ENCOUNTER — Encounter: Payer: Self-pay | Admitting: Gastroenterology

## 2020-08-05 ENCOUNTER — Other Ambulatory Visit: Payer: Self-pay

## 2020-08-05 ENCOUNTER — Ambulatory Visit (AMBULATORY_SURGERY_CENTER): Payer: BC Managed Care – PPO | Admitting: Gastroenterology

## 2020-08-05 ENCOUNTER — Encounter: Payer: Self-pay | Admitting: Gastroenterology

## 2020-08-05 VITALS — BP 102/67 | HR 70 | Temp 97.1°F | Resp 23 | Ht 63.0 in | Wt 154.6 lb

## 2020-08-05 DIAGNOSIS — D124 Benign neoplasm of descending colon: Secondary | ICD-10-CM

## 2020-08-05 DIAGNOSIS — D123 Benign neoplasm of transverse colon: Secondary | ICD-10-CM

## 2020-08-05 DIAGNOSIS — Z1211 Encounter for screening for malignant neoplasm of colon: Secondary | ICD-10-CM | POA: Diagnosis present

## 2020-08-05 DIAGNOSIS — K635 Polyp of colon: Secondary | ICD-10-CM

## 2020-08-05 MED ORDER — SODIUM CHLORIDE 0.9 % IV SOLN: 500.0000 mL | Freq: Once | INTRAVENOUS | Status: DC

## 2020-08-05 NOTE — Patient Instructions (Signed)
Handouts on hemorrhoids and polyps given to you today  Await pathology results    YOU HAD AN ENDOSCOPIC PROCEDURE TODAY AT [facility]:   Refer to the procedure report that was given to you for any specific questions about what was found during the examination.  If the procedure report does not answer your questions, please call your gastroenterologist to clarify.  If you requested that your care partner not be given the details of your procedure findings, then the procedure report has been included in a sealed envelope for you to review at your convenience later.  YOU SHOULD EXPECT: Some feelings of bloating in the abdomen. Passage of more gas than usual.  Walking can help get rid of the air that was put into your GI tract during the procedure and reduce the bloating. If you had a lower endoscopy (such as a colonoscopy or flexible sigmoidoscopy) you may notice spotting of blood in your stool or on the toilet paper. If you underwent a bowel prep for your procedure, you may not have a normal bowel movement for a few days.  Please Note:  You might notice some irritation and congestion in your nose or some drainage.  This is from the oxygen used during your procedure.  There is no need for concern and it should clear up in a day or so.  SYMPTOMS TO REPORT IMMEDIATELY:   Following lower endoscopy (colonoscopy or flexible sigmoidoscopy):  Excessive amounts of blood in the stool  Significant tenderness or worsening of abdominal pains  Swelling of the abdomen that is new, acute  Fever of 100F or higher  For urgent or emergent issues, a gastroenterologist can be reached at any hour by calling [phone]. Do not use MyChart messaging for urgent concerns.    DIET:  We do recommend a small meal at first, but then you may proceed to your regular diet.  Drink plenty of fluids but you should avoid alcoholic beverages for 24 hours.  ACTIVITY:  You should plan to take it easy for the  rest of today and you should NOT DRIVE or use heavy machinery until tomorrow (because of the sedation medicines used during the test).    FOLLOW UP: Our staff will call the number listed on your records 48-72 hours following your procedure to check on you and address any questions or concerns that you may have regarding the information given to you following your procedure. If we do not reach you, we will leave a message.  We will attempt to reach you two times.  During this call, we will ask if you have developed any symptoms of COVID 19. If you develop any symptoms (ie: fever, flu-like symptoms, shortness of breath, cough etc.) before then, please call [phone].  If you test positive for Covid 19 in the 2 weeks post procedure, please call and report this information to [place].    If any biopsies were taken you will be contacted by phone or by letter within the next 1-3 weeks.  Please call us at [phone] if you have not heard about the biopsies in 3 weeks.    SIGNATURES/CONFIDENTIALITY: You and/or your care partner have signed paperwork which will be entered into your electronic medical record.  These signatures attest to the fact that that the information above on your After Visit Summary has been reviewed and is understood.  Full responsibility of the confidentiality of this discharge information lies with you and/or your care-partner.

## 2020-08-05 NOTE — Progress Notes (Signed)
PT taken to PACU. Monitors in place. VSS. Report given to RN. 

## 2020-08-05 NOTE — Op Note (Signed)
[facility] Patient Name: Etienne Millward Procedure Date: 08/05/2020 10:43 AM MRN: [id number] Endoscopist: Ladene Artist , MD Age: 50 Referring MD:  Date of Birth: Jan 27, 1971 Gender: Female Account #: [id number] Procedure:                Colonoscopy Indications:              Screening for colorectal malignant neoplasm Medicines:                Monitored Anesthesia Care Procedure:                Pre-Anesthesia Assessment:                           - Prior to the procedure, a History and Physical                            was performed, and patient medications and                            allergies were reviewed. The patient's tolerance of                            previous anesthesia was also reviewed. The risks                            and benefits of the procedure and the sedation                            options and risks were discussed with the patient.                            All questions were answered, and informed consent                            was obtained. Prior Anticoagulants: The patient has                            taken no previous anticoagulant or antiplatelet                            agents. ASA Grade Assessment: II - A patient with                            mild systemic disease. After reviewing the risks                            and benefits, the patient was deemed in                            satisfactory condition to undergo the procedure.                           After obtaining informed consent, the colonoscope  was passed under direct vision. Throughout the                            procedure, the patient's blood pressure, pulse, and                            oxygen saturations were monitored continuously. The                            Olympus CF-HQ190 [id number]) Colonoscope was                            introduced through the anus and advanced to the the                            cecum, identified by  appendiceal orifice and                            ileocecal valve. The ileocecal valve, appendiceal                            orifice, and rectum were photographed. The quality                            of the bowel preparation was good. The colonoscopy                            was performed without difficulty. The patient                            tolerated the procedure well. Scope In: 10:52:13 AM Scope Out: 11:09:29 AM Scope Withdrawal Time: 0 hours 13 minutes 21 seconds  Total Procedure Duration: 0 hours 17 minutes 16 seconds  Findings:                 The perianal and digital rectal examinations were                            normal.                           Two sessile polyps were found in the descending                            colon and transverse colon. The polyps were 7 to 10                            mm in size. These polyps were removed with a cold                            snare. Resection and retrieval were complete.                           Internal hemorrhoids were found during  retroflexion. The hemorrhoids were small and Grade                            I (internal hemorrhoids that do not prolapse).                           The exam was otherwise without abnormality on                            direct and retroflexion views. Complications:            No immediate complications. Estimated blood loss:                            None. Estimated Blood Loss:     Estimated blood loss: none. Impression:               - Two 7 to 10 mm polyps in the descending colon and                            in the transverse colon, removed with a cold snare.                            Resected and retrieved.                           - Internal hemorrhoids.                           - The examination was otherwise normal on direct                            and retroflexion views. Recommendation:           - Repeat colonoscopy after studies are  complete for                            surveillance based on pathology results.                           - Patient has a contact number available for                            emergencies. The signs and symptoms of potential                            delayed complications were discussed with the                            patient. Return to normal activities tomorrow.                            Written discharge instructions were provided to the                            patient.                           -  Resume previous diet.                           - Continue present medications.                           - Await pathology results. Ladene Artist, MD 08/05/2020 11:11:22 AM This report has been signed electronically.

## 2020-08-05 NOTE — Progress Notes (Signed)
[facility]  Pt's states no medical or surgical changes since previsit or office visit.  

## 2020-08-05 NOTE — Progress Notes (Signed)
Called to room to assist during endoscopic procedure.  Patient ID and intended procedure confirmed with present staff. Received instructions for my participation in the procedure from the performing physician.  

## 2020-08-07 ENCOUNTER — Telehealth: Payer: Self-pay

## 2020-08-07 NOTE — Telephone Encounter (Signed)
  Follow up Call-  Call back number 08/05/2020  Post procedure Call Back phone  # [phone] number  Permission to leave phone message Yes  Some recent data might be hidden     Patient questions:  Do you have a fever, pain , or abdominal swelling? No. Pain Score  0 *  Have you tolerated food without any problems? Yes.    Have you been able to return to your normal activities? Yes.    Do you have any questions about your discharge instructions: Diet   No. Medications  No. Follow up visit  No.  Do you have questions or concerns about your Care? No.  Actions: * If pain score is 4 or above: No action needed, pain <4.   1. Have you developed a fever since your procedure? No   2.   Have you had an respiratory symptoms (SOB or cough) since your procedure? No   3.   Have you tested positive for COVID 19 since your procedure? No   4.   Have you had any family members/close contacts diagnosed with the COVID 19 since your procedure?  No    If yes to any of these questions please route to Joylene John, RN and Joella Prince, RN

## 2020-08-14 ENCOUNTER — Encounter: Payer: Self-pay | Admitting: Gastroenterology

## 2020-12-18 ENCOUNTER — Ambulatory Visit (HOSPITAL_BASED_OUTPATIENT_CLINIC_OR_DEPARTMENT_OTHER): Payer: BC Managed Care – PPO | Attending: Physician Assistant | Admitting: Physical Therapy

## 2020-12-18 ENCOUNTER — Other Ambulatory Visit: Payer: Self-pay

## 2020-12-18 ENCOUNTER — Encounter (HOSPITAL_BASED_OUTPATIENT_CLINIC_OR_DEPARTMENT_OTHER): Payer: Self-pay | Admitting: Physical Therapy

## 2020-12-18 DIAGNOSIS — M25561 Pain in right knee: Secondary | ICD-10-CM | POA: Insufficient documentation

## 2020-12-18 DIAGNOSIS — M6281 Muscle weakness (generalized): Secondary | ICD-10-CM | POA: Insufficient documentation

## 2020-12-18 DIAGNOSIS — M25562 Pain in left knee: Secondary | ICD-10-CM | POA: Diagnosis present

## 2020-12-18 DIAGNOSIS — Z90710 Acquired absence of both cervix and uterus: Secondary | ICD-10-CM | POA: Diagnosis not present

## 2020-12-18 NOTE — Therapy (Signed)
[facility] [place], [place], [address] Phone: [phone]   Fax:  [phone]  Physical Therapy Evaluation  Patient Details  Name: Kathy Garrett MRN: [id number] Date of Birth: 1970/11/06 Referring Provider (PT): Matthew Saras, [place]   Encounter Date: 12/18/2020   PT End of Session - 12/18/20 1635     Visit Number 1    Number of Visits 13    Date for PT Re-Evaluation 03/18/21    Authorization Type BCBS    PT Start Time [id number]    PT Stop Time 1430    PT Time Calculation (min) 45 min    Activity Tolerance Patient tolerated treatment well;No increased pain    Behavior During Therapy [facility] for tasks assessed/performed             Past Medical History:  Diagnosis Date   Anemia    years ago   Anxiety    Bronchitis 06/2016   Glaucoma    past hx of drops    Hyperlipidemia    pt denies    Hypertension    Peripheral vascular disease ([place])    right leg   PONV (postoperative nausea and vomiting)    Trigger thumb of left hand     Past Surgical History:  Procedure Laterality Date   ABDOMINAL HYSTERECTOMY     CESAREAN SECTION  09/04/2009   DIAGNOSTIC LAPAROSCOPY     08/2008   HYSTERECTOMY ABDOMINAL WITH SALPINGECTOMY Bilateral 10/26/2016   Procedure: HYSTERECTOMY ABDOMINAL WITH SALPINGECTOMY;  Surgeon: Louretta Shorten, MD;  Location: [facility];  Service: Gynecology;  Laterality: Bilateral;   MYOMECTOMY  09/1999   MYOMECTOMY  03/2007   TRIGGER FINGER RELEASE Left 11/02/2017   Procedure: RELEASE TRIGGER FINGER/A-1 PULLEY LEFT THUMB;  Surgeon: Daryll Brod, MD;  Location: [facility];  Service: Orthopedics;  Laterality: Left;   WISDOM TOOTH EXTRACTION      There were no vitals filed for this visit.    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 Pt denies history of injury to either knee. She has had 2  shots in the R knee. She bilateral knee pain with R>L. She states she has knee OA in both knees. Pt locates pain to anterior portion of knee below the patella. Pt describes pain as constant ache. She states she has not been working out here at [employer] and felt difficulty with leg press due to strain and pain. Popping clicking cracking in both knees with R>L. Pain only occurs after long of work. She states she does currently have a soft knee brace. Worst 8/10, Low 0/10. Aggs: squatting, standing, sitting too long; Eases: icy hot, ibuprofen, propping the knee up. Pt denies legs giving way. Pt denies cancer red flags. Pt denies NT. Pt denies fever,nausea, vomitting. Pt is a member at [employer] but has not done LE workouts yet.    Limitations Standing;Walking;Sitting    How long can you sit comfortably? <1hr    How long can you stand comfortably? >1hr but painful    How long can you walk comfortably? 30 mins    Diagnostic tests X-ray OA    Patient Stated Goals Pt would like to learn some basic exercises to strengthen her knees and build muscle for both knees.    Currently in Pain? No/denies  [facility] PT Assessment - 12/18/20 0001       Assessment   Medical Diagnosis Patellar knee pain    Referring Provider (PT) Shepperson, Kirstin, PA-C    Prior Therapy N/A      Precautions   Precautions None      Restrictions   Weight Bearing Restrictions No      Balance Screen   Has the patient fallen in the past 6 months No    Has the patient had a decrease in activity level because of a fear of falling?  No    Is the patient reluctant to leave their home because of a fear of falling?  No      Home Ecologist residence      Prior Function   Level of Independence Independent      Cognition   Overall Cognitive Status Within Functional Limits for tasks assessed      Observation/Other Assessments   Other Surveys  Lower Extremity Functional Scale     Lower Extremity Functional Scale  to be added at next session      Sensation   Light Touch Appears Intact      Functional Tests   Functional tests Squat;Lunges      Squat   Comments decreased depth, increased hip flexion      Lunges   Comments decreased eccentric lower with bilat, frontal plane knee deviation bilat, complaint of "straining"      Posture/Postural Control   Posture/Postural Control No significant limitations      ROM / Strength   AROM / PROM / Strength AROM;PROM;Strength      AROM   Overall AROM Comments R  knee flex 140, 4 hyper ext; L  flex 139, ext 4 hyper      PROM   Overall PROM Comments WNL bilat      Strength   Overall Strength Comments 4/5 flexion and ext on R, 4+/5 on L      Flexibility   Soft Tissue Assessment /Muscle Length yes    Hamstrings WFL    Quadriceps WFL, still stretching sensation with prone quad stretch      Palpation   Palpation comment No TTP to quad or HS, no joint line tenderness, bilat patellar sup and inf mobility deficits, ML WFL      Special Tests    Special Tests Knee Special Tests    Knee Special tests  other;other2      other    Findings Negative    Comments Ant drawer, Lachman, McMurray, Varus Valgus at 0 and 30      Transfers   Five time sit to stand comments  WFL, decreased eccentric control, increased muscle fatigue after testing      Ambulation/Gait   Ambulation Distance (Feet) 30 Feet    Gait Pattern Decreased dorsiflexion - right;Decreased dorsiflexion - left    Stairs Yes    Stair Management Technique No rails;Alternating pattern;Other (comment)   decreased eccentric lowering control                       Objective measurements completed on examination: See above findings.       [facility] Adult PT Treatment/Exercise - 12/18/20 0001       Exercises   Exercises Knee/Hip             Exercises Prone Quadriceps Stretch with Strap - 2 x daily - 7 x weekly - 1 sets -  3 reps - 30  hold Squat with Chair Touch - 2 x daily - 7 x weekly - 3 sets - 10 reps Wall Sit - 2 x daily - 7 x weekly - 1 sets - 5 reps - 15 hold       PT Education - 12/18/20 1635     Education Details MOI, diagnosis, prognosis, anatomy, exercise progression, DOMS expectations, muscle firing, HEP, POC, exam findings    Person(s) Educated Patient    Methods Explanation;Demonstration;Tactile cues;Verbal cues;Handout    Comprehension Verbalized understanding;Returned demonstration;Tactile cues required;Verbal cues required              PT Short Term Goals - 12/18/20 1648       PT SHORT TERM GOAL #1   Title Pt will become independent with HEP in order to demonstrate synthesis of PT education.    Time 4    Period Weeks    Status New      PT SHORT TERM GOAL #2   Title Pt will be able to demonstrate SL squat to chair in order to demonstrate functional improvement in LE function for stair mobility and daily mobility.    Time 4    Period Weeks    Status New      PT SHORT TERM GOAL #3   Title Pt will be able to demonstrate 5/5 R ankle R knee extension and flexion in order to demonstrate functional improvement in R LE function for return to PLOF.    Time 4    Period Weeks    Status New               PT Long Term Goals - 12/18/20 1650       PT LONG TERM GOAL #1   Title Pt  will become independent with final HEP in order to demonstrate synthesis of PT education.    Time 8    Period Weeks    Status New      PT LONG TERM GOAL #2   Title Pt will be able to demonstrate SL squat to chair in order to demonstrate functional improvement in LE function for stair mobility and daily mobility.    Time 8    Period Weeks    Status New      PT LONG TERM GOAL #3   Title Pt will be able to demonstrate/report ability to sit/stand for extended periods without pain in order to demonstrate functional improvement and tolerance to static positioning for occupational tasks.    Time 8    Period  Weeks    Status New      PT LONG TERM GOAL #4   Title Pt will be able to squat/lift/leg press >50 lbs in order to demonstrate functional improvement in LE strength for return to PLOF and exercise.                    Plan - 12/18/20 1638     Clinical Impression Statement Pt is a 50 y.o. female presenting to PT eval today for CC of bilateral knee pain with R>L. Pt presents with knee extensor and flexor weakness, soft tissue restriction, and difficulty with sustained daily activity. Pt's s/s appear consistent with generalized anterior knee pain possibly a patellar tendinopathy given pt's location of pain as well as complaint with static, long duration loading. Pt's R quad weakness is her largest deficit and likely has decreased functional capacity of her knee extensor mechanism. Pt's impairment limits their participation  with ADL, exercise, and daily mobility. Pt would benefit from continued skilled therapy in order to reach goals and maximize functional bilateral LE strength and ROM for full return to PLOF.    Personal Factors and Comorbidities Time since onset of injury/illness/exacerbation;Fitness    Examination-Activity Limitations Locomotion Level;Transfers;Squat;Stairs;Stand;Sit    Examination-Participation Restrictions Occupation;Community Activity;Shop    Stability/Clinical Decision Making Stable/Uncomplicated    Clinical Decision Making Low    Rehab Potential Excellent    PT Frequency 1x / week    PT Duration 8 weeks    PT Treatment/Interventions ADLs/Self Care Home Management;Aquatic Therapy;Electrical Stimulation;Cryotherapy;Iontophoresis '4mg'$ /ml Dexamethasone;Moist Heat;Traction;Ultrasound;Gait training;Stair training;Functional mobility training;Therapeutic activities;Therapeutic exercise;Balance training;Neuromuscular re-education;Patient/family education;Manual techniques;Passive range of motion;Dry needling;Vasopneumatic Device;Taping;Joint Manipulations;Spinal Manipulations     PT Next Visit Plan leg press up 2 down 1, knee extension isometric, calf and HS stretch    PT Home Exercise Plan Access Code: [email or web address]    Consulted and Agree with Plan of Care Patient             Patient will benefit from skilled therapeutic intervention in order to improve the following deficits and impairments:  Decreased range of motion, Pain, Decreased strength, Decreased mobility, Hypomobility, Impaired flexibility, Increased muscle spasms, Decreased activity tolerance  Visit Diagnosis: Pain in joint of right knee  Pain in joint of left knee  Muscle weakness (generalized)     Problem List Patient Active Problem List   Diagnosis Date Noted   S/P TAH (total abdominal hysterectomy) 10/26/2016    Daleen Bo PT, DPT 12/18/20 6:48 PM  [facility] [address] [place], [place], [address] Phone: [phone]   Fax:  [phone]  Name: AELA DRYDEN MRN: [id number] Date of Birth: Aug 08, 1970

## 2020-12-18 NOTE — Patient Instructions (Signed)
Access Code: [email or web address] URL: [email or web address] Date: 12/18/2020 Prepared by: Daleen Bo  Exercises Prone Quadriceps Stretch with Strap - 2 x daily - 7 x weekly - 1 sets - 3 reps - 30 hold Squat with Chair Touch - 2 x daily - 7 x weekly - 3 sets - 10 reps Wall Sit - 2 x daily - 7 x weekly - 1 sets - 5 reps - 15 hold

## 2020-12-25 ENCOUNTER — Other Ambulatory Visit: Payer: Self-pay

## 2020-12-25 ENCOUNTER — Ambulatory Visit (HOSPITAL_BASED_OUTPATIENT_CLINIC_OR_DEPARTMENT_OTHER): Payer: BC Managed Care – PPO | Admitting: Physical Therapy

## 2020-12-25 DIAGNOSIS — M6281 Muscle weakness (generalized): Secondary | ICD-10-CM

## 2020-12-25 DIAGNOSIS — M25562 Pain in left knee: Secondary | ICD-10-CM

## 2020-12-25 DIAGNOSIS — M25561 Pain in right knee: Secondary | ICD-10-CM

## 2020-12-25 NOTE — Therapy (Signed)
[place], [place], [address] Phone: [phone]   Fax:  [phone]  Physical Therapy Treatment  Patient Details  Name: Kathy Garrett MRN: [id number] Date of Birth: [birth year] Referring Provider (PT): Matthew Saras, [place]   Encounter Date: 12/25/2020   PT End of Session - 12/25/20 1635     Visit Number 2    Number of Visits 13    Date for PT Re-Evaluation 03/18/21    Authorization Type BCBS    PT Start Time 1600    PT Stop Time 1640    PT Time Calculation (min) 40 min    Activity Tolerance Patient tolerated treatment well;No increased pain    Behavior During Therapy [facility] for tasks assessed/performed             Past Medical History:  Diagnosis Date   Anemia    years ago   Anxiety    Bronchitis 06/2016   Glaucoma    past hx of drops    Hyperlipidemia    pt denies    Hypertension    Peripheral vascular disease ([place])    right leg   PONV (postoperative nausea and vomiting)    Trigger thumb of left hand     Past Surgical History:  Procedure Laterality Date   ABDOMINAL HYSTERECTOMY     CESAREAN SECTION  09/04/2009   DIAGNOSTIC LAPAROSCOPY     08/2008   HYSTERECTOMY ABDOMINAL WITH SALPINGECTOMY Bilateral 10/26/2016   Procedure: HYSTERECTOMY ABDOMINAL WITH SALPINGECTOMY;  Surgeon: Louretta Shorten, MD;  Location: [facility];  Service: Gynecology;  Laterality: Bilateral;   MYOMECTOMY  09/1999   MYOMECTOMY  03/2007   TRIGGER FINGER RELEASE Left 11/02/2017   Procedure: RELEASE TRIGGER FINGER/A-1 PULLEY LEFT THUMB;  Surgeon: Daryll Brod, MD;  Location: [place];  Service: Orthopedics;  Laterality: Left;   WISDOM TOOTH EXTRACTION      There were no vitals filed for this visit.   Subjective Assessment - 12/25/20 1605     Subjective Pt states the knee pain is much better. Pain is maybe down to a 4/10. She has not worn the brace at all.    Limitations Standing;Walking;Sitting     How long can you sit comfortably? <1hr    How long can you stand comfortably? >1hr but painful    How long can you walk comfortably? 30 mins    Diagnostic tests X-ray OA    Patient Stated Goals Pt would like to learn some basic exercises to strengthen her knees and build muscle for both knees.    Currently in Pain? No/denies                               [facility] Adult PT Treatment/Exercise - 12/25/20 0001       Transfers   Five time sit to stand comments  WFL, decreased eccentric control, increased muscle fatigue after testing                           Posture/Postural Control         Exercises   Exercises Knee/Hip      Knee/Hip Exercises: Stretches   Quad Stretch Limitations 30s 3x with belt      Knee/Hip Exercises: Machines for Strengthening   Total Gym Leg Press 45# 3x10 with edu about set up and form/technique     Knee/Hip Exercises:  Standing   Other Standing Knee Exercises Squat with chair touch 3x10    Other Standing Knee Exercises wall sit 15s 3x      Knee/Hip Exercises: Supine   Bridges Limitations  Double leg and bridge with march 2x10  eacg     Manual Therapy   Manual Therapy Soft tissue mobilization  R rec fem and VL                   PT Education - 12/25/20 1631     Education Details anatomy, exercise progression, DOMS expectations, muscle firing, HEP update    Person(s) Educated Patient    Methods Explanation;Demonstration;Tactile cues;Verbal cues;Handout    Comprehension Verbalized understanding;Returned demonstration;Verbal cues required;Tactile cues required              PT Short Term Goals - 12/18/20 1648       PT SHORT TERM GOAL #1   Title Pt will become independent with HEP in order to demonstrate synthesis of PT education.    Time 4    Period Weeks    Status New      PT SHORT TERM GOAL #2   Title Pt will be able to demonstrate SL squat to chair in order to demonstrate functional improvement in LE  function for stair mobility and daily mobility.    Time 4    Period Weeks    Status New      PT SHORT TERM GOAL #3   Title Pt will be able to demonstrate 5/5 R ankle R knee extension and flexion in order to demonstrate functional improvement in R LE function for return to PLOF.    Time 4    Period Weeks    Status New               PT Long Term Goals - 12/18/20 1650       PT LONG TERM GOAL #1   Title Pt  will become independent with final HEP in order to demonstrate synthesis of PT education.    Time 8    Period Weeks    Status New      PT LONG TERM GOAL #2   Title Pt will be able to demonstrate SL squat to chair in order to demonstrate functional improvement in LE function for stair mobility and daily mobility.    Time 8    Period Weeks    Status New      PT LONG TERM GOAL #3   Title Pt will be able to demonstrate/report ability to sit/stand for extended periods without pain in order to demonstrate functional improvement and tolerance to static positioning for occupational tasks.    Time 8    Period Weeks    Status New      PT LONG TERM GOAL #4   Title Pt will be able to squat/lift/leg press >50 lbs in order to demonstrate functional improvement in LE strength for return to PLOF and exercise.                   Plan - 12/25/20 1635     Clinical Impression Statement Pt able to continue with progression of quadriceps strengthening exercise today without pain. Pt able to progress loading of knee extension as well as incorporating gluteal strengthening without pain. Pt given thorough edu about leg press set up when working out with personal trainer and appropriate LE gym exercise. HEP updated accordingly. Pt progressing very well and will likely decrease  frequency of visits within 2-3 weeks if pt is consistent with home strengthening program. Pt would benefit from continued skilled therapy in order to reach goals and maximize functional bilateral LE strength and ROM  for full return to PLOF.    Personal Factors and Comorbidities Time since onset of injury/illness/exacerbation;Fitness    Examination-Activity Limitations Locomotion Level;Transfers;Squat;Stairs;Stand;Sit    Examination-Participation Restrictions Occupation;Community Activity;Shop    Stability/Clinical Decision Making Stable/Uncomplicated    Rehab Potential Excellent    PT Frequency 1x / week    PT Duration 8 weeks    PT Treatment/Interventions ADLs/Self Care Home Management;Aquatic Therapy;Electrical Stimulation;Cryotherapy;Iontophoresis '4mg'$ /ml Dexamethasone;Moist Heat;Traction;Ultrasound;Gait training;Stair training;Functional mobility training;Therapeutic activities;Therapeutic exercise;Balance training;Neuromuscular re-education;Patient/family education;Manual techniques;Passive range of motion;Dry needling;Vasopneumatic Device;Taping;Joint Manipulations;Spinal Manipulations    PT Next Visit Plan leg press up 2 down 1, knee extension isometric, calf and HS stretch    PT Home Exercise Plan Access Code: [email or web address]    Consulted and Agree with Plan of Care Patient             Patient will benefit from skilled therapeutic intervention in order to improve the following deficits and impairments:  Decreased range of motion, Pain, Decreased strength, Decreased mobility, Hypomobility, Impaired flexibility, Increased muscle spasms, Decreased activity tolerance  Visit Diagnosis: Pain in joint of right knee  Pain in joint of left knee  Muscle weakness (generalized)     Problem List Patient Active Problem List   Diagnosis Date Noted   S/P TAH (total abdominal hysterectomy) 10/26/2016    Daleen Bo PT, DPT 12/25/20 5:41 PM  [facility] [address] [place], [place], [address] Phone: [phone]   Fax:  [phone]  Name: Kathy Garrett MRN: [id number] Date of Birth: 12/09/1970

## 2020-12-25 NOTE — Patient Instructions (Signed)
Access Code: [email or web address] URL: [email or web address] Date: 12/25/2020 Prepared by: Daleen Bo  Exercises Prone Quadriceps Stretch with Strap - 2 x daily - 7 x weekly - 1 sets - 3 reps - 30 hold Squat with Chair Touch - 2 x daily - 7 x weekly - 3 sets - 10 reps Wall Sit - 2 x daily - 7 x weekly - 1 sets - 5 reps - 15 hold Marching Bridge - 1 x daily - 7 x weekly - 3 sets - 10 reps

## 2021-01-02 ENCOUNTER — Ambulatory Visit (HOSPITAL_BASED_OUTPATIENT_CLINIC_OR_DEPARTMENT_OTHER): Payer: BC Managed Care – PPO | Admitting: Physical Therapy

## 2021-01-02 ENCOUNTER — Encounter (HOSPITAL_BASED_OUTPATIENT_CLINIC_OR_DEPARTMENT_OTHER): Payer: Self-pay | Admitting: Physical Therapy

## 2021-01-02 ENCOUNTER — Other Ambulatory Visit: Payer: Self-pay

## 2021-01-02 DIAGNOSIS — M6281 Muscle weakness (generalized): Secondary | ICD-10-CM

## 2021-01-02 DIAGNOSIS — M25562 Pain in left knee: Secondary | ICD-10-CM | POA: Diagnosis not present

## 2021-01-02 DIAGNOSIS — M25561 Pain in right knee: Secondary | ICD-10-CM

## 2021-01-02 NOTE — Therapy (Signed)
[place], [place], [address] Phone: [phone]   Fax:  [phone]  Physical Therapy Treatment  Patient Details  Name: Kathy Garrett MRN: [id number] Date of Birth: 02-Apr-1971 Referring Provider (PT): Matthew Saras, [place]   Encounter Date: 01/02/2021   PT End of Session - 01/02/21 1606     Visit Number 3    Number of Visits 13    Date for PT Re-Evaluation 03/18/21    Authorization Type BCBS    PT Start Time 1600    PT Stop Time [id number]    PT Time Calculation (min) 38 min    Activity Tolerance Patient tolerated treatment well;No increased pain    Behavior During Therapy [facility] for tasks assessed/performed             Past Medical History:  Diagnosis Date   Anemia    years ago   Anxiety    Bronchitis 06/2016   Glaucoma    past hx of drops    Hyperlipidemia    pt denies    Hypertension    Peripheral vascular disease ([place])    right leg   PONV (postoperative nausea and vomiting)    Trigger thumb of left hand     Past Surgical History:  Procedure Laterality Date   ABDOMINAL HYSTERECTOMY     CESAREAN SECTION  09/04/2009   DIAGNOSTIC LAPAROSCOPY     08/2008   HYSTERECTOMY ABDOMINAL WITH SALPINGECTOMY Bilateral 10/26/2016   Procedure: HYSTERECTOMY ABDOMINAL WITH SALPINGECTOMY;  Surgeon: Louretta Shorten, MD;  Location: [facility];  Service: Gynecology;  Laterality: Bilateral;   MYOMECTOMY  09/1999   MYOMECTOMY  03/2007   TRIGGER FINGER RELEASE Left 11/02/2017   Procedure: RELEASE TRIGGER FINGER/A-1 PULLEY LEFT THUMB;  Surgeon: Daryll Brod, MD;  Location: [facility];  Service: Orthopedics;  Laterality: Left;   WISDOM TOOTH EXTRACTION      There were no vitals filed for this visit.   Subjective Assessment - 01/02/21 1604     Subjective Pt states the pain is a 4/10 pain. She noticed a little more pain after last session following leg press. Pt reports intermittently performing HEP.     Limitations Standing;Walking;Sitting    How long can you sit comfortably? <1hr    How long can you stand comfortably? >1hr but painful    How long can you walk comfortably? 30 mins    Diagnostic tests X-ray OA    Patient Stated Goals Pt would like to learn some basic exercises to strengthen her knees and build muscle for both knees.    Currently in Pain? No/denies    Multiple Pain Sites No                               [facility] Adult PT Treatment/Exercise - 01/02/21 0001                                     Posture/Postural Control   Posture/Postural Control No significant limitations      Exercises   Exercises Knee/Hip      Knee/Hip Exercises: Stretches   Passive Hamstring Stretch Limitations 30s 3x seated    Quad Stretch Limitations 30s 3x with belt      Knee/Hip Exercises: Machines for Strengthening   Cybex Knee Extension isometric at 60 deg flexion  5s 10x  Knee/Hip Exercises: Standing   Other Standing Knee Exercises Squat with chair touch 2x10; sidestepping GTB 2x58f (on hold)   Other Standing Knee Exercises wall sit 15s 3x      Knee/Hip Exercises: Supine   Bridges Limitations bridge with march 2x10                    PT Education - 01/02/21 1605     Education Details anatomy, exercise progression, DOMS expectations, muscle firing, HEP    Person(s) Educated Patient    Methods Explanation;Tactile cues;Demonstration;Verbal cues    Comprehension Verbalized understanding;Returned demonstration              PT Short Term Goals - 12/18/20 1648       PT SHORT TERM GOAL #1   Title Pt will become independent with HEP in order to demonstrate synthesis of PT education.    Time 4    Period Weeks    Status New      PT SHORT TERM GOAL #2   Title Pt will be able to demonstrate SL squat to chair in order to demonstrate functional improvement in LE function for stair mobility and daily mobility.    Time 4    Period Weeks     Status New      PT SHORT TERM GOAL #3   Title Pt will be able to demonstrate 5/5 R ankle R knee extension and flexion in order to demonstrate functional improvement in R LE function for return to PLOF.    Time 4    Period Weeks    Status New               PT Long Term Goals - 12/18/20 1650       PT LONG TERM GOAL #1   Title Pt  will become independent with final HEP in order to demonstrate synthesis of PT education.    Time 8    Period Weeks    Status New      PT LONG TERM GOAL #2   Title Pt will be able to demonstrate SL squat to chair in order to demonstrate functional improvement in LE function for stair mobility and daily mobility.    Time 8    Period Weeks    Status New      PT LONG TERM GOAL #3   Title Pt will be able to demonstrate/report ability to sit/stand for extended periods without pain in order to demonstrate functional improvement and tolerance to static positioning for occupational tasks.    Time 8    Period Weeks    Status New      PT LONG TERM GOAL #4   Title Pt will be able to squat/lift/leg press >50 lbs in order to demonstrate functional improvement in LE strength for return to PLOF and exercise.                   Plan - 01/02/21 1615     Clinical Impression Statement Pt exercise program modified due to increaased anterior knee pain following leg press machine at last session. Though pt did have mixed compliance since last visit. HEP and treatment session focused on improving soft tissue extensibility as well and lateral hip strengthening to improve LE mechanics with compound movements. Pt appears to tolerate isometrics better at this point in time. Plan to review and progress to full ROM as tolerated at next session.  Pt would benefit from continued skilled therapy in order  to reach goals and maximize functional bilateral LE strength and ROM for full return to PLOF.    Personal Factors and Comorbidities Time since onset of  injury/illness/exacerbation;Fitness    Examination-Activity Limitations Locomotion Level;Transfers;Squat;Stairs;Stand;Sit    Examination-Participation Restrictions Occupation;Community Activity;Shop    Stability/Clinical Decision Making Stable/Uncomplicated    Rehab Potential Excellent    PT Frequency 1x / week    PT Duration 8 weeks    PT Treatment/Interventions ADLs/Self Care Home Management;Aquatic Therapy;Electrical Stimulation;Cryotherapy;Iontophoresis '4mg'$ /ml Dexamethasone;Moist Heat;Traction;Ultrasound;Gait training;Stair training;Functional mobility training;Therapeutic activities;Therapeutic exercise;Balance training;Neuromuscular re-education;Patient/family education;Manual techniques;Passive range of motion;Dry needling;Vasopneumatic Device;Taping;Joint Manipulations;Spinal Manipulations    PT Next Visit Plan leg press up 2 down 1, knee extension isometric, calf and HS stretch    PT Home Exercise Plan Access Code: [email or web address]    Consulted and Agree with Plan of Care Patient             Patient will benefit from skilled therapeutic intervention in order to improve the following deficits and impairments:  Decreased range of motion, Pain, Decreased strength, Decreased mobility, Hypomobility, Impaired flexibility, Increased muscle spasms, Decreased activity tolerance  Visit Diagnosis: Pain in joint of right knee  Pain in joint of left knee  Muscle weakness (generalized)     Problem List Patient Active Problem List   Diagnosis Date Noted   S/P TAH (total abdominal hysterectomy) 10/26/2016   Daleen Bo PT, DPT 01/02/21 4:40 PM  [facility] [address] [place], [place], [address] Phone: [phone]   Fax:  [phone]  Name: Kathy Garrett MRN: [id number] Date of Birth: 1970-07-18

## 2021-01-09 ENCOUNTER — Encounter (HOSPITAL_BASED_OUTPATIENT_CLINIC_OR_DEPARTMENT_OTHER): Payer: BC Managed Care – PPO | Admitting: Physical Therapy

## 2021-01-14 ENCOUNTER — Other Ambulatory Visit: Payer: Self-pay

## 2021-01-14 ENCOUNTER — Ambulatory Visit (HOSPITAL_BASED_OUTPATIENT_CLINIC_OR_DEPARTMENT_OTHER): Payer: BC Managed Care – PPO | Attending: Physician Assistant | Admitting: Physical Therapy

## 2021-01-14 DIAGNOSIS — M6281 Muscle weakness (generalized): Secondary | ICD-10-CM | POA: Diagnosis present

## 2021-01-14 DIAGNOSIS — M25562 Pain in left knee: Secondary | ICD-10-CM | POA: Diagnosis present

## 2021-01-14 DIAGNOSIS — M25561 Pain in right knee: Secondary | ICD-10-CM | POA: Diagnosis not present

## 2021-01-14 NOTE — Therapy (Signed)
[place], [place], [address] Phone: [phone]   Fax:  [phone]  Physical Therapy Treatment  Patient Details  Name: Kathy Garrett MRN: [id number] Date of Birth: [birth year] Referring Provider (PT): Matthew Saras, [place]   Encounter Date: 01/14/2021   PT End of Session - 01/14/21 1651     Visit Number 4    Number of Visits 13    Date for PT Re-Evaluation 03/18/21    Authorization Type BCBS    PT Start Time [id number]    PT Stop Time [id number]    PT Time Calculation (min) 40 min    Activity Tolerance Patient tolerated treatment well;No increased pain    Behavior During Therapy [facility] for tasks assessed/performed             Past Medical History:  Diagnosis Date   Anemia    years ago   Anxiety    Bronchitis 06/2016   Glaucoma    past hx of drops    Hyperlipidemia    pt denies    Hypertension    Peripheral vascular disease ([place])    right leg   PONV (postoperative nausea and vomiting)    Trigger thumb of left hand     Past Surgical History:  Procedure Laterality Date   ABDOMINAL HYSTERECTOMY     CESAREAN SECTION  09/04/2009   DIAGNOSTIC LAPAROSCOPY     08/2008   HYSTERECTOMY ABDOMINAL WITH SALPINGECTOMY Bilateral 10/26/2016   Procedure: HYSTERECTOMY ABDOMINAL WITH SALPINGECTOMY;  Surgeon: Louretta Shorten, MD;  Location: [facility];  Service: Gynecology;  Laterality: Bilateral;   MYOMECTOMY  09/1999   MYOMECTOMY  03/2007   TRIGGER FINGER RELEASE Left 11/02/2017   Procedure: RELEASE TRIGGER FINGER/A-1 PULLEY LEFT THUMB;  Surgeon: Daryll Brod, MD;  Location: [facility];  Service: Orthopedics;  Laterality: Left;   WISDOM TOOTH EXTRACTION      There were no vitals filed for this visit.   Subjective Assessment - 01/14/21 1649     Subjective Pt states her pain is much better. She notices less aching at night.    Limitations Standing;Walking;Sitting    How long can you sit comfortably?  <1hr    How long can you stand comfortably? >1hr but painful    How long can you walk comfortably? 30 mins    Diagnostic tests X-ray OA    Patient Stated Goals Pt would like to learn some basic exercises to strengthen her knees and build muscle for both knees.    Currently in Pain? No/denies    Multiple Pain Sites No                               [facility] Adult PT Treatment/Exercise - 01/14/21 0001       Posture/Postural Control   Posture/Postural Control No significant limitations      Exercises   Exercises Knee/Hip      Knee/Hip Exercises: Stretches   Passive Hamstring Stretch Limitations 30s 3x seated          Knee/Hip Exercises: Aerobic   Nustep L4 76mn      Knee/Hip Exercises: Machines for Strengthening   Cybex Knee Extension isometric at 60 5s 10x    Total Gym Leg Press 75# 2x10      Knee/Hip Exercises: Standing   Other Standing Knee Exercises sidestepping RTB 2x365f   Other Standing Knee Exercises wall sit 20s 3x  Knee/Hip Exercises: Seated   Sit to Sand 20 reps   15lb KB; slow down' quick up     Knee/Hip Exercises: Supine   Bridges Limitations fig 4 bridge 2x10                  Upper Extremity Functional Index Score :   /80   PT Education - 01/14/21 1651     Education Details anatomy, exercise progression, DOMS expectations, muscle firing, HEP    Person(s) Educated Patient    Methods Explanation;Demonstration;Tactile cues;Verbal cues    Comprehension Verbalized understanding;Returned demonstration              PT Short Term Goals - 12/18/20 1648       PT SHORT TERM GOAL #1   Title Pt will become independent with HEP in order to demonstrate synthesis of PT education.    Time 4    Period Weeks    Status New      PT SHORT TERM GOAL #2   Title Pt will be able to demonstrate SL squat to chair in order to demonstrate functional improvement in LE function for stair mobility and daily mobility.    Time 4    Period  Weeks    Status New      PT SHORT TERM GOAL #3   Title Pt will be able to demonstrate 5/5 R ankle R knee extension and flexion in order to demonstrate functional improvement in R LE function for return to PLOF.    Time 4    Period Weeks    Status New               PT Long Term Goals - 12/18/20 1650       PT LONG TERM GOAL #1   Title Pt  will become independent with final HEP in order to demonstrate synthesis of PT education.    Time 8    Period Weeks    Status New      PT LONG TERM GOAL #2   Title Pt will be able to demonstrate SL squat to chair in order to demonstrate functional improvement in LE function for stair mobility and daily mobility.    Time 8    Period Weeks    Status New      PT LONG TERM GOAL #3   Title Pt will be able to demonstrate/report ability to sit/stand for extended periods without pain in order to demonstrate functional improvement and tolerance to static positioning for occupational tasks.    Time 8    Period Weeks    Status New      PT LONG TERM GOAL #4   Title Pt will be able to squat/lift/leg press >50 lbs in order to demonstrate functional improvement in LE strength for return to PLOF and exercise.                   Plan - 01/14/21 1658     Clinical Impression Statement Pt able to progress quadriceps strengthening at this session without increased pain. Pt report of improved compliance with strengthening exercise likely has improved tolerance to knee extension loading. HEP has been updated at this time with reduced frequency and increased intensity to promote improved extensor mechanism strength. Pt to reduce frequency of clinic visits to promote independent management of strengthening. Pt would benefit from continued skilled therapy in order to reach goals and maximize functional bilateral LE strength and ROM for full return to PLOF.  Personal Factors and Comorbidities Time since onset of injury/illness/exacerbation;Fitness     Examination-Activity Limitations Locomotion Level;Transfers;Squat;Stairs;Stand;Sit    Examination-Participation Restrictions Occupation;Community Activity;Shop    Stability/Clinical Decision Making Stable/Uncomplicated    Rehab Potential Excellent    PT Frequency 1x / week    PT Duration 8 weeks    PT Treatment/Interventions ADLs/Self Care Home Management;Aquatic Therapy;Electrical Stimulation;Cryotherapy;Iontophoresis '4mg'$ /ml Dexamethasone;Moist Heat;Traction;Ultrasound;Gait training;Stair training;Functional mobility training;Therapeutic activities;Therapeutic exercise;Balance training;Neuromuscular re-education;Patient/family education;Manual techniques;Passive range of motion;Dry needling;Vasopneumatic Device;Taping;Joint Manipulations;Spinal Manipulations    PT Next Visit Plan calf and HS stretch, 2 up i1 down leg press, BB backsquat, RDL; progress note    PT Home Exercise Plan Access Code: [email or web address]    Consulted and Agree with Plan of Care Patient             Patient will benefit from skilled therapeutic intervention in order to improve the following deficits and impairments:  Decreased range of motion, Pain, Decreased strength, Decreased mobility, Hypomobility, Impaired flexibility, Increased muscle spasms, Decreased activity tolerance  Visit Diagnosis: Pain in joint of right knee  Pain in joint of left knee  Muscle weakness (generalized)     Problem List Patient Active Problem List   Diagnosis Date Noted   S/P TAH (total abdominal hysterectomy) 10/26/2016    Daleen Bo, PT 01/14/2021, 5:35 PM  [place], [place], [address] Phone: [phone]   Fax:  [phone]  Name: Kathy Garrett MRN: [id number] Date of Birth: Nov 26, 1970

## 2021-02-06 ENCOUNTER — Ambulatory Visit (HOSPITAL_BASED_OUTPATIENT_CLINIC_OR_DEPARTMENT_OTHER): Payer: BC Managed Care – PPO | Admitting: Physical Therapy

## 2021-02-06 ENCOUNTER — Encounter (HOSPITAL_BASED_OUTPATIENT_CLINIC_OR_DEPARTMENT_OTHER): Payer: Self-pay | Admitting: Physical Therapy

## 2021-02-06 ENCOUNTER — Other Ambulatory Visit: Payer: Self-pay

## 2021-02-06 DIAGNOSIS — M25561 Pain in right knee: Secondary | ICD-10-CM | POA: Diagnosis not present

## 2021-02-06 DIAGNOSIS — M6281 Muscle weakness (generalized): Secondary | ICD-10-CM

## 2021-02-06 DIAGNOSIS — M25562 Pain in left knee: Secondary | ICD-10-CM

## 2021-02-06 NOTE — Patient Instructions (Signed)
Access Code: [email or web address] URL: [email or web address] Date: 02/06/2021 Prepared by: Daleen Bo  Exercises Prone Quadriceps Stretch with Strap - 2 x daily - 7 x weekly - 1 sets - 3 reps - 30 hold Squat with Chair Touch - 1 x daily - 3-4 x weekly - 3 sets - 10 reps Wall Sit - 1 x daily - 3-4 x weekly - 1 sets - 5 reps - 30 hold Figure 4 Bridge - 1 x daily - 3-4 x weekly - 3 sets - 10 reps Side Stepping with Resistance at Ankles - 1 x daily - 3-4 x weekly - 1 sets - 2 reps - 56ft hold Half Deadlift with Kettlebell - 1 x daily - 3-4 x weekly - 3 sets - 10 reps

## 2021-02-06 NOTE — Therapy (Signed)
[facility] [address] [place], [place], [address] Phone: [phone]   Fax:  [phone]  Physical Therapy Discharge  Patient Details  Name: Kathy Garrett MRN: [id number] Date of Birth: 11/19/1970 Referring Provider (PT): Julien Girt, [place]   Encounter Date: 02/06/2021   PT End of Session - 02/06/21 1645     Visit Number 5    Number of Visits 13    Date for PT Re-Evaluation 03/18/21    Authorization Type BCBS    PT Start Time 1645    PT Stop Time 1709    PT Time Calculation (min) 24 min    Activity Tolerance Patient tolerated treatment well;No increased pain    Behavior During Therapy [facility] for tasks assessed/performed             Past Medical History:  Diagnosis Date   Anemia    years ago   Anxiety    Bronchitis 06/2016   Glaucoma    past hx of drops    Hyperlipidemia    pt denies    Hypertension    Peripheral vascular disease (HCC)    right leg   PONV (postoperative nausea and vomiting)    Trigger thumb of left hand     Past Surgical History:  Procedure Laterality Date   ABDOMINAL HYSTERECTOMY     CESAREAN SECTION  09/04/2009   DIAGNOSTIC LAPAROSCOPY     08/2008   HYSTERECTOMY ABDOMINAL WITH SALPINGECTOMY Bilateral 10/26/2016   Procedure: HYSTERECTOMY ABDOMINAL WITH SALPINGECTOMY;  Surgeon: Candice Camp, MD;  Location: [facility];  Service: Gynecology;  Laterality: Bilateral;   MYOMECTOMY  09/1999   MYOMECTOMY  03/2007   TRIGGER FINGER RELEASE Left 11/02/2017   Procedure: RELEASE TRIGGER FINGER/A-1 PULLEY LEFT THUMB;  Surgeon: Cindee Salt, MD;  Location:  S[facility];  Service: Orthopedics;  Laterality: Left;   WISDOM TOOTH EXTRACTION      There were no vitals filed for this visit.   Subjective Assessment - 02/06/21 1646     Subjective Pt states she has had no pain since she last came to PT. 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 Pt agrees to D/C  from PT.   Limitations Standing;Walking;Sitting    How long can you sit comfortably? <1hr    How long can you stand comfortably? >1hr but painful    How long can you walk comfortably? 30 mins    Diagnostic tests X-ray OA    Patient Stated Goals Pt would like to learn some basic exercises to strengthen her knees and build muscle for both knees.    Currently in Pain? No/denies    Multiple Pain Sites No                [facility] PT Assessment - 02/06/21 0001       Assessment   Medical Diagnosis Patellar knee pain    Referring Provider (PT) Shepperson, Kirstin, PA-C    Prior Therapy N/A      Precautions   Precautions None      Restrictions   Weight Bearing Restrictions No      Balance Screen   Has the patient fallen in the past 6 months No    Has the patient had a decrease in activity level because of a fear of falling?  No    Is the patient reluctant to leave their home because of a fear of falling?  No  Home Environment   Living Environment Private residence      Prior Function   Level of Independence Independent      Cognition   Overall Cognitive Status Within Functional Limits for tasks assessed      Observation/Other Assessments   Other Surveys  Lower Extremity Functional Scale    Lower Extremity Functional Scale  79 / 80 = 98.8 %      Sensation   Light Touch Appears Intact      Functional Tests   Functional tests Squat;Lunges      Squat   Comments WNL, limited depth due to ankle DF ROM deficits      Lunges   Comments WNL      Posture/Postural Control   Posture/Postural Control No significant limitations      AROM   Overall AROM Comments WNL bilat      PROM   Overall PROM Comments WNL bilat      Strength   Overall Strength Comments 5/5 flex and ext bilat      Flexibility   Soft Tissue Assessment /Muscle Length yes    Hamstrings WFL    Quadriceps WFL                                                  Ambulation/Gait    Ambulation Distance (Feet) 50 Feet    Gait Pattern Decreased dorsiflexion - right;Decreased dorsiflexion - left                                   [facility] Adult PT Treatment/Exercise - 02/06/21 0001       Exercises   Exercises Knee/Hip      Knee/Hip Exercises: Stretches   Passive Hamstring Stretch Limitations 30s 3x seated    Quad Stretch Limitations 30s 3x with belt                            Knee/Hip Exercises: Standing   [place] deadlift 15lb KB 2x10         Knee/Hip Exercises:    Sit to [place] squat  Both 20 reps   15lb KB; slow down' quick up                              PT Education - 02/06/21 1646     Education Details anatomy, exercise progression, DOMS expectations, muscle firing, HEP, DC planning    Person(s) Educated Patient    Methods Explanation;Tactile cues;Demonstration;Verbal cues;Handout    Comprehension Verbalized understanding;Returned demonstration;Verbal cues required              PT Short Term Goals - 02/06/21 1718       PT SHORT TERM GOAL #1   Title Pt will become independent with HEP in order to demonstrate synthesis of PT education.    Time 4    Period Weeks    Status Achieved      PT SHORT TERM GOAL #2   Title Pt will be able to demonstrate SL squat to chair in order to demonstrate functional improvement in LE function for stair mobility and daily mobility.    Time 4    Period Weeks  Status Partially Met      PT SHORT TERM GOAL #3   Title Pt will be able to demonstrate 5/5 R R knee extension and flexion in order to demonstrate functional improvement in R LE function for return to PLOF.    Time 4    Period Weeks    Status Achieved               PT Long Term Goals - 02/06/21 1718       PT LONG TERM GOAL #1   Title Pt  will become independent with final HEP in order to demonstrate synthesis of PT education.    Time 8    Period Weeks    Status Achieved      PT LONG TERM GOAL  #2   Title Pt will be able to demonstrate SL squat to chair in order to demonstrate functional improvement in LE function for stair mobility and daily mobility.    Time 8    Period Weeks    Status Partially Met      PT LONG TERM GOAL #3   Title Pt will be able to demonstrate/report ability to sit/stand for extended periods without pain in order to demonstrate functional improvement and tolerance to static positioning for occupational tasks.    Time 8    Period Weeks    Status Achieved                            Plan - 02/06/21 1719     Clinical Impression Statement Pt has met all reported goals and most functional goals with PT. Pt has had no pain in several weeks and is indepdent with home/gym strengthening exercise for LE. Pt gives verbal understanding to edu regarding maintaining strength or choosing to taper exercise program. Pt understand self progression for exercise as well. Finalized HEP updated and provided to pt. No further needs for skilled therapy. D/C this episode of care.    Personal Factors and Comorbidities Time since onset of injury/illness/exacerbation;Fitness    Examination-Activity Limitations Locomotion Level;Transfers;Squat;Stairs;Stand;Sit    Examination-Participation Restrictions Occupation;Community Activity;Shop    Stability/Clinical Decision Making Stable/Uncomplicated    Rehab Potential Excellent    PT Frequency 1x / week    PT Duration 8 weeks    PT Treatment/Interventions ADLs/Self Care Home Management;Aquatic Therapy;Electrical Stimulation;Cryotherapy;Iontophoresis 4mg /ml Dexamethasone;Moist Heat;Traction;Ultrasound;Gait training;Stair training;Functional mobility training;Therapeutic activities;Therapeutic exercise;Balance training;Neuromuscular re-education;Patient/family education;Manual techniques;Passive range of motion;Dry needling;Vasopneumatic Device;Taping;Joint Manipulations;Spinal Manipulations        PT Home Exercise Plan Access Code:  [email or web address]    Consulted and Agree with Plan of Care Patient             Patient will benefit from skilled therapeutic intervention in order to improve the following deficits and impairments:  Decreased range of motion, Pain, Decreased strength, Decreased mobility, Hypomobility, Impaired flexibility, Increased muscle spasms, Decreased activity tolerance  Visit Diagnosis: Pain in joint of right knee  Pain in joint of left knee  Muscle weakness (generalized)     Problem List Patient Active Problem List   Diagnosis Date Noted   S/P TAH (total abdominal hysterectomy) 10/26/2016    Daleen Bo, PT 02/06/2021, 5:27 PM  [facility] [place], [place], [address] Phone: [phone]   Fax:  [phone]  Name: Kathy Garrett MRN: [id number] Date of Birth: 1970/12/11   PHYSICAL THERAPY DISCHARGE SUMMARY  Visits from Start of Care: 5  Plan:  Patient agrees to discharge.  Patient goals were mostly met. Patient is being discharged due to meeting the stated rehab goals and being satisfied with current level of function.

## 2023-06-07 ENCOUNTER — Encounter: Payer: Self-pay | Admitting: Gastroenterology

## 2023-08-06 ENCOUNTER — Ambulatory Visit (AMBULATORY_SURGERY_CENTER): Payer: 59

## 2023-08-06 VITALS — Ht 62.5 in | Wt 145.0 lb

## 2023-08-06 DIAGNOSIS — Z860100 Personal history of colon polyps, unspecified: Secondary | ICD-10-CM

## 2023-08-06 MED ORDER — NA SULFATE-K SULFATE-MG SULF 17.5-3.13-1.6 GM/177ML PO SOLN: 1.0000 | Freq: Once | ORAL | 0 refills | Status: AC

## 2023-08-06 NOTE — Patient Instructions (Signed)
 [facility] has implemented a new process for scheduling procedures.  Please note your arrival time for the [facility] is your appointment time that is shown on your written instructions.  Please do not arrive one hour prior to the time listed in your instructions.  Please ignore any outside notifications to arrive one hour early.  We apologize for any confusion and look forward to seeing you for your procedure.

## 2023-08-06 NOTE — Progress Notes (Signed)
 No egg or soy allergy known to patient  No issues known to pt with past sedation with any surgeries or procedures Patient denies ever being told they had issues or difficulty with intubation  No FH of Malignant Hyperthermia Pt is not on diet pills Pt is not on  home 02  Pt is not on blood thinners  Pt denies issues with constipation  No A fib or A flutter Have any cardiac testing pending-- independent   LOA: independent  Prep: suprep   Patient's chart reviewed by Cathlyn Parsons CNRA prior to previsit and patient appropriate for the [facility].  Previsit completed and red dot placed by patient's name on their procedure day (on provider's schedule).     PV completed with patient. Prep instructions sent via mychart and home address.

## 2023-08-18 ENCOUNTER — Encounter: Payer: Self-pay | Admitting: Gastroenterology

## 2023-08-20 ENCOUNTER — Ambulatory Visit (AMBULATORY_SURGERY_CENTER): Payer: Self-pay | Admitting: Gastroenterology

## 2023-08-20 ENCOUNTER — Encounter: Payer: Self-pay | Admitting: Gastroenterology

## 2023-08-20 VITALS — BP 106/71 | HR 61 | Temp 97.3°F | Resp 13 | Ht 62.5 in | Wt 145.0 lb

## 2023-08-20 DIAGNOSIS — K648 Other hemorrhoids: Secondary | ICD-10-CM | POA: Diagnosis not present

## 2023-08-20 DIAGNOSIS — K644 Residual hemorrhoidal skin tags: Secondary | ICD-10-CM

## 2023-08-20 DIAGNOSIS — Z1211 Encounter for screening for malignant neoplasm of colon: Secondary | ICD-10-CM | POA: Diagnosis present

## 2023-08-20 DIAGNOSIS — Z860101 Personal history of adenomatous and serrated colon polyps: Secondary | ICD-10-CM

## 2023-08-20 DIAGNOSIS — Z860100 Personal history of colon polyps, unspecified: Secondary | ICD-10-CM

## 2023-08-20 MED ORDER — SODIUM CHLORIDE 0.9 % IV SOLN: 500.0000 mL | Freq: Once | INTRAVENOUS | Status: DC

## 2023-08-20 NOTE — Progress Notes (Signed)
 Pt's states no medical or surgical changes since previsit or office visit.

## 2023-08-20 NOTE — Progress Notes (Signed)
 [facility] History and Physical   Primary Care Physician:  Garlan Fillers, MD   Reason for Procedure:  History of adenomatous colon polyps  Plan:    Surveillance colonoscopy with possible interventions as needed     HPI: Kathy Garrett is a very pleasant 53 y.o. female here for surveillance colonoscopy. Denies any nausea, vomiting, abdominal pain, melena or bright red blood per rectum  The risks and benefits as well as alternatives of endoscopic procedure(s) have been discussed and reviewed. All questions answered. The patient agrees to proceed.    Past Medical History:  Diagnosis Date   Anemia    years ago   Anxiety    Bronchitis 06/2016   Glaucoma    past hx of drops    Hyperlipidemia    pt denies    Hypertension    Peripheral vascular disease (HCC)    right leg   PONV (postoperative nausea and vomiting)    Trigger thumb of left hand     Past Surgical History:  Procedure Laterality Date   ABDOMINAL HYSTERECTOMY     CESAREAN SECTION  09/04/2009   COLONOSCOPY     DIAGNOSTIC LAPAROSCOPY     08/2008   HYSTERECTOMY ABDOMINAL WITH SALPINGECTOMY Bilateral 10/26/2016   Procedure: HYSTERECTOMY ABDOMINAL WITH SALPINGECTOMY;  Surgeon: Candice Camp, MD;  Location: [facility];  Service: Gynecology;  Laterality: Bilateral;   MYOMECTOMY  09/1999   MYOMECTOMY  03/2007   TRIGGER FINGER RELEASE Left 11/02/2017   Procedure: RELEASE TRIGGER FINGER/A-1 PULLEY LEFT THUMB;  Surgeon: Cindee Salt, MD;  Location: [facility];  Service: Orthopedics;  Laterality: Left;   WISDOM TOOTH EXTRACTION      Prior to Admission medications   Medication Sig Start Date End Date Taking? Authorizing Provider  ALPRAZolam Prudy Feeler) 0.25 MG tablet Take 1 tablet by mouth at bedtime as needed for anxiety. 10/08/16  Yes [provider]  ascorbic acid (VITAMIN C) 500 MG tablet Take 500 mg by mouth daily.   Yes [provider]  hydrocortisone 2.5 % cream Apply 1  Application topically 2 (two) times daily as needed.   Yes [provider]  losartan (COZAAR) 100 MG tablet Take 1 tablet by mouth daily. 08/30/20  Yes [provider]  metoprolol succinate (TOPROL-XL) 50 MG 24 hr tablet Take 50 mg by mouth 2 (two) times daily. Take with or immediately following a meal.   Yes [provider]  ibuprofen (ADVIL) 200 MG tablet Take 200 mg by mouth every 6 (six) hours as needed. 07/24/20   [provider]    Current Outpatient Medications  Medication Sig Dispense Refill   ALPRAZolam (XANAX) 0.25 MG tablet Take 1 tablet by mouth at bedtime as needed for anxiety.  2   ascorbic acid (VITAMIN C) 500 MG tablet Take 500 mg by mouth daily.     hydrocortisone 2.5 % cream Apply 1 Application topically 2 (two) times daily as needed.     losartan (COZAAR) 100 MG tablet Take 1 tablet by mouth daily.     metoprolol succinate (TOPROL-XL) 50 MG 24 hr tablet Take 50 mg by mouth 2 (two) times daily. Take with or immediately following a meal.     ibuprofen (ADVIL) 200 MG tablet Take 200 mg by mouth every 6 (six) hours as needed.     Current Facility-Administered Medications  Medication Dose Route Frequency Provider Last Rate Last Admin   0.9 %  sodium chloride infusion  500 mL Intravenous Once Katriel Cutsforth,  Eleonore Chiquito, MD        Allergies as of 08/20/2023   (No Known Allergies)    Family History  Problem Relation Age of Onset   Colon polyps Mother    Colon cancer Neg Hx    Esophageal cancer Neg Hx    Rectal cancer Neg Hx    Stomach cancer Neg Hx     Social History   Socioeconomic History   Marital status: Married    Spouse name: Not on file   Number of children: Not on file   Years of education: Not on file   Highest education level: Not on file  Occupational History   Not on file  Tobacco Use   Smoking status: Never   Smokeless tobacco: Never  Vaping Use   Vaping status: Never Used  Substance and Sexual Activity   Alcohol use:  No   Drug use: No   Sexual activity: Yes    Birth control/protection: Surgical  Other Topics Concern   Not on file  Social History Narrative   Not on file   Social Drivers of Health   Financial Resource Strain: Not on file  Food Insecurity: Not on file  Transportation Needs: Not on file  Physical Activity: Not on file  Stress: Not on file  Social Connections: Unknown (09/23/2021)   Received from [facility]   Social Network    Social Network: Not on file  Intimate Partner Violence: Unknown (08/15/2021)   Received from [facility]   HITS    Physically Hurt: Not on file    Insult or Talk Down To: Not on file    Threaten Physical Harm: Not on file    Scream or Curse: Not on file    Review of Systems:  All other review of systems negative except as mentioned in the HPI.  Physical Exam: Vital signs in last 24 hours: BP 130/87   Pulse 65   Temp (!) 97.3 F (36.3 C) (Skin)   Ht 5' 2.5" (1.588 m)   Wt 145 lb (65.8 kg)   SpO2 100%   BMI 26.10 kg/m  General:   Alert, NAD Lungs:  Clear .   Heart:  Regular rate and rhythm Abdomen:  Soft, nontender and nondistended. Neuro/Psych:  Alert and cooperative. Normal mood and affect. A and O x 3  Reviewed labs, radiology imaging, old records and pertinent past GI work up  Patient is appropriate for planned procedure(s) and anesthesia in an ambulatory setting   K. Scherry Ran , MD [phone]

## 2023-08-20 NOTE — Progress Notes (Signed)
 Report to PACU, RN, vss, BBS= Clear.

## 2023-08-20 NOTE — Patient Instructions (Signed)

## 2023-08-20 NOTE — Op Note (Signed)
 [facility] Patient Name: Kathy Garrett Procedure Date: 08/20/2023 11:15 AM MRN: [id number] Endoscopist: Napoleon Form , MD, [id number] Age: 53 Referring MD:  Date of Birth: Jan 05, 1971 Gender: Female Account #: [id number] Procedure:                Colonoscopy Indications:              High risk colon cancer surveillance: Personal                            history of adenoma (10 mm or greater in size) Medicines:                Monitored Anesthesia Care Procedure:                Pre-Anesthesia Assessment:                           - Prior to the procedure, a History and Physical                            was performed, and patient medications and                            allergies were reviewed. The patient's tolerance of                            previous anesthesia was also reviewed. The risks                            and benefits of the procedure and the sedation                            options and risks were discussed with the patient.                            All questions were answered, and informed consent                            was obtained. Prior Anticoagulants: The patient has                            taken no anticoagulant or antiplatelet agents. ASA                            Grade Assessment: II - A patient with mild systemic                            disease. After reviewing the risks and benefits,                            the patient was deemed in satisfactory condition to                            undergo the procedure.  After obtaining informed consent, the colonoscope                            was passed under direct vision. Throughout the                            procedure, the patient's blood pressure, pulse, and                            oxygen saturations were monitored continuously. The                            Olympus Scope SN [id number] was introduced through the                            anus and  advanced to the the cecum, identified by                            appendiceal orifice and ileocecal valve. The                            colonoscopy was performed without difficulty. The                            patient tolerated the procedure well. The quality                            of the bowel preparation was good. The ileocecal                            valve, appendiceal orifice, and rectum were                            photographed. Scope In: 11:21:23 AM Scope Out: 11:33:45 AM Scope Withdrawal Time: 0 hours 7 minutes 3 seconds  Total Procedure Duration: 0 hours 12 minutes 22 seconds  Findings:                 The perianal and digital rectal examinations were                            normal.                           Non-bleeding external and internal hemorrhoids were                            found during retroflexion. The hemorrhoids were                            small.                           The exam was otherwise normal throughout the  examined colon. Complications:            No immediate complications. Estimated Blood Loss:     Estimated blood loss was minimal. Impression:               - Non-bleeding external and internal hemorrhoids.                           - No specimens collected. Recommendation:           - Patient has a contact number available for                            emergencies. The signs and symptoms of potential                            delayed complications were discussed with the                            patient. Return to normal activities tomorrow.                            Written discharge instructions were provided to the                            patient.                           - Resume previous diet.                           - Continue present medications.                           - Repeat colonoscopy in 5 years for surveillance. Napoleon Form, MD 08/20/2023 11:44:49 AM This report  has been signed electronically.

## 2023-08-24 ENCOUNTER — Telehealth: Payer: Self-pay

## 2023-08-24 NOTE — Telephone Encounter (Signed)
  Follow up Call-     08/20/2023   10:44 AM  Call back number  Post procedure Call Back phone  # [phone]  Permission to leave phone message Yes     Patient questions:  Do you have a fever, pain , or abdominal swelling? No Pain Score  0 *  Have you tolerated food without any problems? Yes.    Have you been able to return to your normal activities? Yes.    Do you have any questions about your discharge instructions: Diet   No. Medications  No. Follow up visit  No.  Do you have questions or concerns about your Care? No.  Actions: * If pain score is 4 or above: No action needed, pain <4.

## 2024-02-28 ENCOUNTER — Encounter: Payer: Self-pay | Admitting: Family Medicine

## 2024-02-28 ENCOUNTER — Ambulatory Visit: Admitting: Family Medicine

## 2024-02-28 VITALS — BP 128/85 | Ht 62.0 in | Wt 153.0 lb

## 2024-02-28 DIAGNOSIS — M79671 Pain in right foot: Secondary | ICD-10-CM

## 2024-02-28 NOTE — Progress Notes (Signed)
 PCP: Yolande Toribio MATSU, MD  HPI: Kathy Garrett is a 53 yo F here for custom orthotics.  She was seen by Dr. Barbra who referred her to us . She's had pain lateral right foot. 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 No injuries.  Past Medical History:  Diagnosis Date   Anemia    years ago   Anxiety    Bronchitis 06/2016   Glaucoma    past hx of drops    Hyperlipidemia    pt denies    Hypertension    Peripheral vascular disease    right leg   PONV (postoperative nausea and vomiting)    Trigger thumb of left hand     Current Outpatient Medications on File Prior to Visit  Medication Sig Dispense Refill   ALPRAZolam  (XANAX ) 0.25 MG tablet Take 1 tablet by mouth at bedtime as needed for anxiety.  2   ascorbic acid (VITAMIN C) 500 MG tablet Take 500 mg by mouth daily.     hydrocortisone 2.5 % cream Apply 1 Application topically 2 (two) times daily as needed.     ibuprofen  (ADVIL ) 200 MG tablet Take 200 mg by mouth every 6 (six) hours as needed.     losartan (COZAAR) 100 MG tablet Take 1 tablet by mouth daily.     metoprolol  succinate (TOPROL -XL) 50 MG 24 hr tablet Take 50 mg by mouth 2 (two) times daily. Take with or immediately following a meal.     No current facility-administered medications on file prior to visit.    Past Surgical History:  Procedure Laterality Date   ABDOMINAL HYSTERECTOMY     CESAREAN SECTION  09/04/2009   COLONOSCOPY     DIAGNOSTIC LAPAROSCOPY     08/2008   HYSTERECTOMY ABDOMINAL WITH SALPINGECTOMY Bilateral 10/26/2016   Procedure: HYSTERECTOMY ABDOMINAL WITH SALPINGECTOMY;  Surgeon: Marget Lenis, MD;  Location: [facility];  Service: Gynecology;  Laterality: Bilateral;   MYOMECTOMY  09/1999   MYOMECTOMY  03/2007   TRIGGER FINGER RELEASE Left 11/02/2017   Procedure: RELEASE TRIGGER FINGER/A-1 PULLEY LEFT THUMB;  Surgeon:  Murrell Kuba, MD;  Location: [facility];  Service: Orthopedics;  Laterality: Left;   WISDOM TOOTH EXTRACTION      No Known Allergies  BP 128/85   Ht 5' 2 (1.575 m)   Wt 153 lb (69.4 kg)   BMI 27.98 kg/m       No data to display              No data to display              Objective:  Physical Exam:  Gen: NAD, comfortable in exam room  Bilateral feet/ankles: No hallux rigidus or valgus.  Mild long arch collapse R>L.  Mild rotation 4th and 5th digits.  No transverse arch collapse.  No other gross deformity, swelling, ecchymoses Full range of motion No tenderness to palpation currently (localizes to 5th MT area on right when hurts). NV intact distally.   Gait: supination notable bilaterally with outturning bilateral feet.    Assessment and Plan Right foot pain - custom orthotics made as below with 5th ray posts.  Follow up with Dr. Meccariello for further treatment.  Patient was fitted for a : standard, cushioned, semi-rigid orthotic. The orthotic was heated and afterward the patient stood on the orthotic  blank positioned on the orthotic stand. The patient was positioned in subtalar neutral position and 10 degrees of ankle dorsiflexion in a weight bearing stance. After completion of molding, a stable base was applied to the orthotic blank. The blank was ground to a stable position for weight bearing. Size: 8 fit and run Base: none Posting: lateral heel wedges and 5th ray posting through midfoot Additional orthotic padding: none
